# Patient Record
Sex: Female | Born: 1965 | Race: White | Hispanic: No | Smoking: Former smoker
Health system: Southern US, Community
[De-identification: ages and names within clinical notes are randomized; demographics above are authoritative.]

## PROBLEM LIST (undated history)

## (undated) DIAGNOSIS — L631 Alopecia universalis: Secondary | ICD-10-CM

## (undated) HISTORY — DX: Alopecia universalis: L63.1

---

## 2005-10-10 ENCOUNTER — Ambulatory Visit: Payer: Self-pay

## 2007-05-10 IMAGING — NM NM BONE LIMITED
1 series · 8 of 8 positions shown · non-contrast
Comparison: none

REASON FOR EXAM: COCCYGEAL PAIN
COMMENTS:

[Series 1: bone statics · 2.40mm/px · 4 acquisitions, 8 frames shown]
[im 1/4]
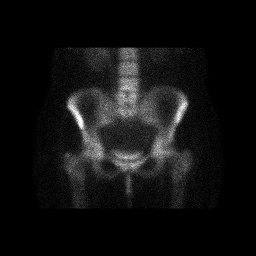
[im 1/4]
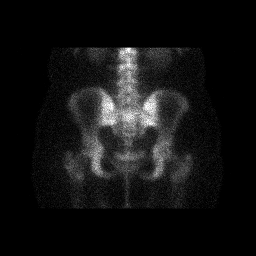
[im 2/4]
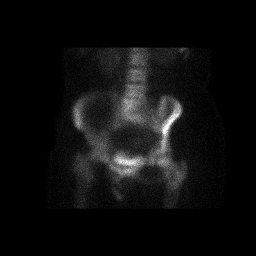
[im 2/4]
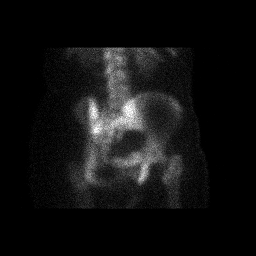
[im 3/4]
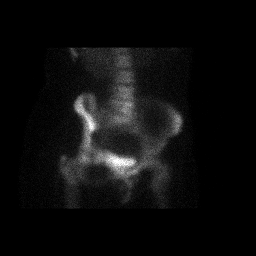
[im 3/4]
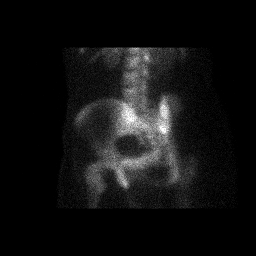
[im 4/4]
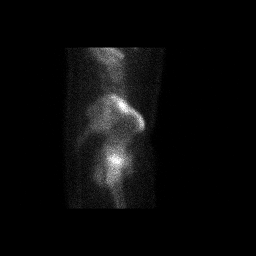
[im 4/4]
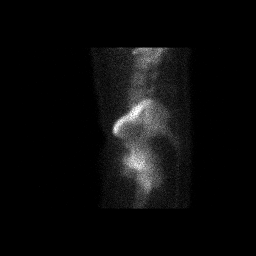

[8 of 8 positions shown; findings below may reference images not displayed]

PROCEDURE:     NM  - NM LIMITED BONE SCAN 3HR [DATE]  [DATE]

RESULT:     Following injection of 22.22 mCi of Technetium 99m MDP, limited
Bone Scan of the pelvis, lower lumbar spine and coccyx was performed.

There is normal distribution of tracer activity in the lower lumbar spine,
pelvis, sacrum and coccyx and hips.
IMPRESSION: No significant abnormalities are noted.

## 2010-07-04 HISTORY — PX: RIGHT OOPHORECTOMY: SHX2359

## 2010-07-04 HISTORY — PX: LAPAROSCOPIC SUPRACERVICAL HYSTERECTOMY: SUR797

## 2010-08-19 ENCOUNTER — Ambulatory Visit: Payer: Self-pay

## 2010-09-16 ENCOUNTER — Ambulatory Visit: Payer: Self-pay | Admitting: Obstetrics & Gynecology

## 2010-09-23 ENCOUNTER — Ambulatory Visit: Payer: Self-pay

## 2010-09-24 LAB — PATHOLOGY REPORT

## 2012-03-18 IMAGING — US US PELV - US TRANSVAGINAL
1 series · 17 of 25 positions shown · non-contrast
Comparison: none

REASON FOR EXAM: menorrhagia irregular menses
COMMENTS:

[Series 1: us pelv - us transvaginal · 17 of 115 slices shown]
[im 1/115]
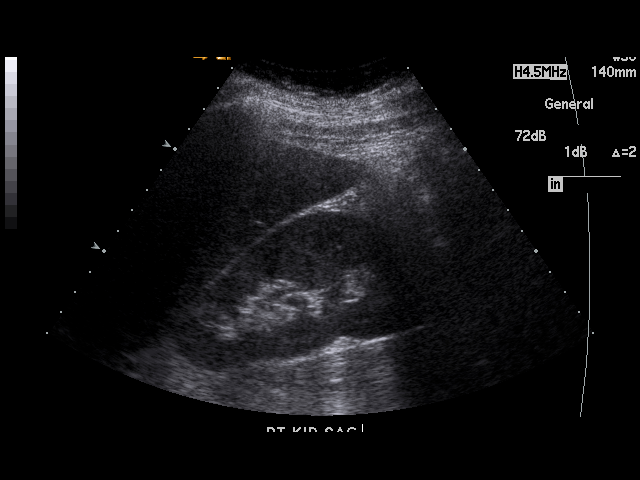
[im 10/115]
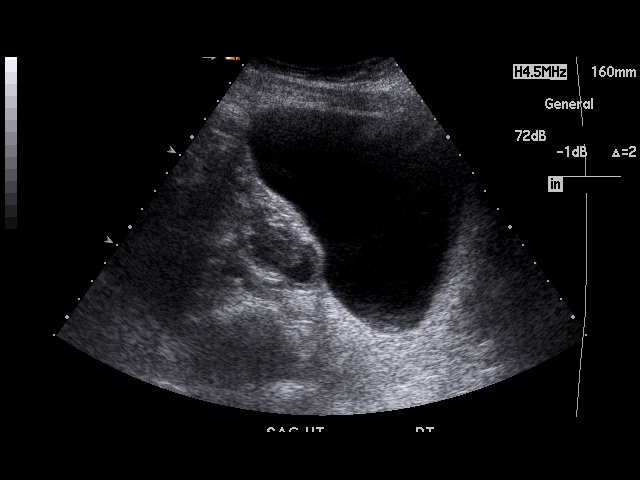
[im 15/115]
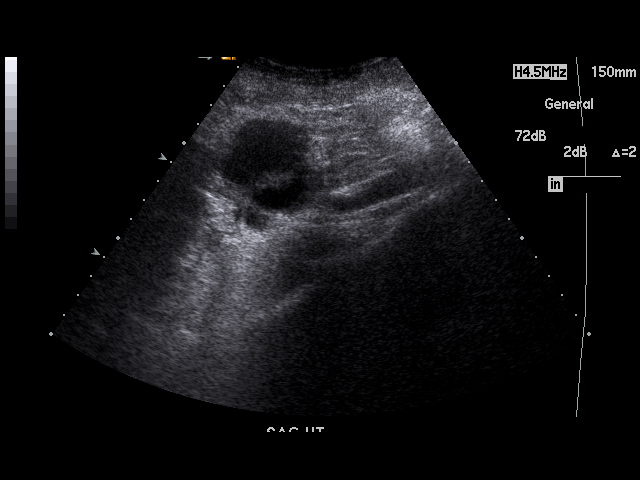
[im 24/115]
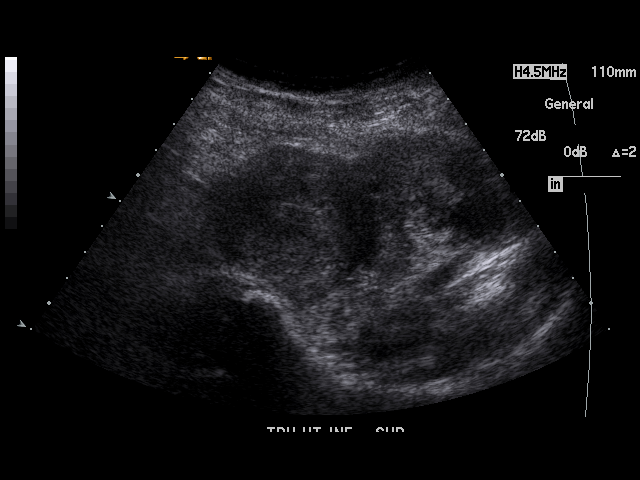
[im 29/115]
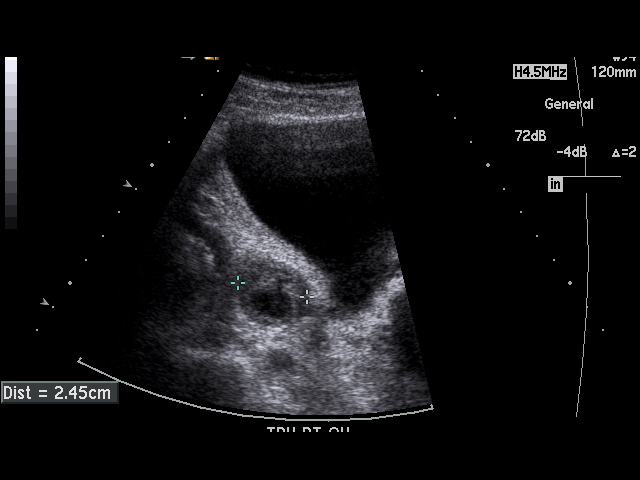
[im 39/115]
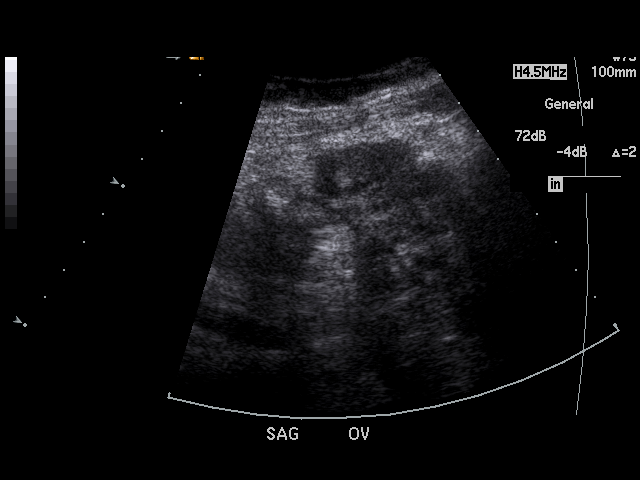
[im 43/115]
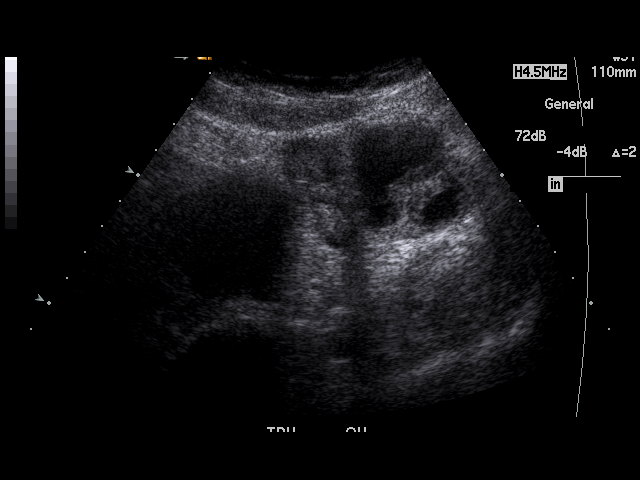
[im 53/115]
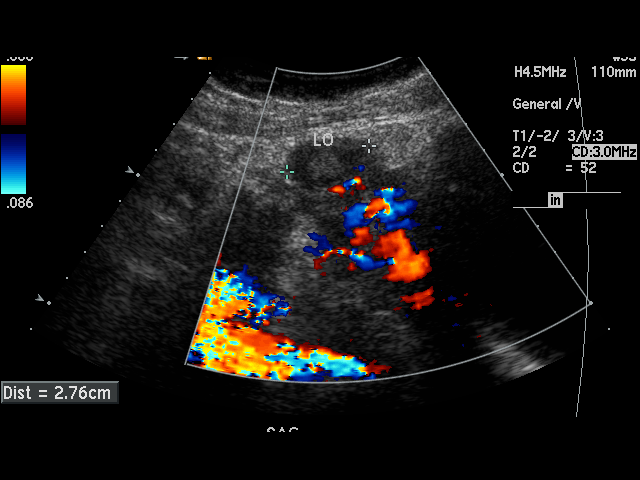
[im 58/115]
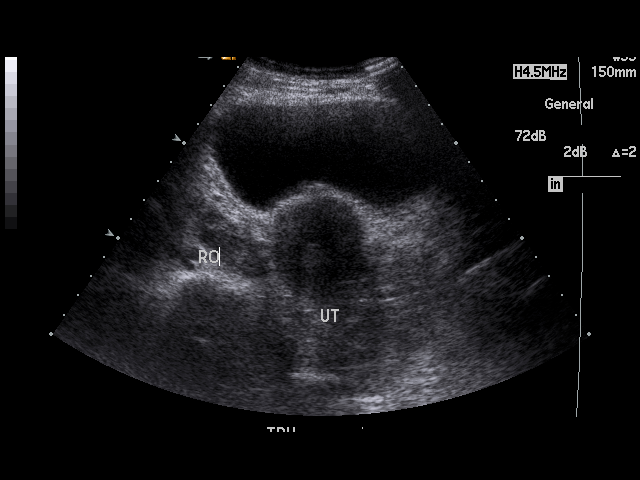
[im 62/115]
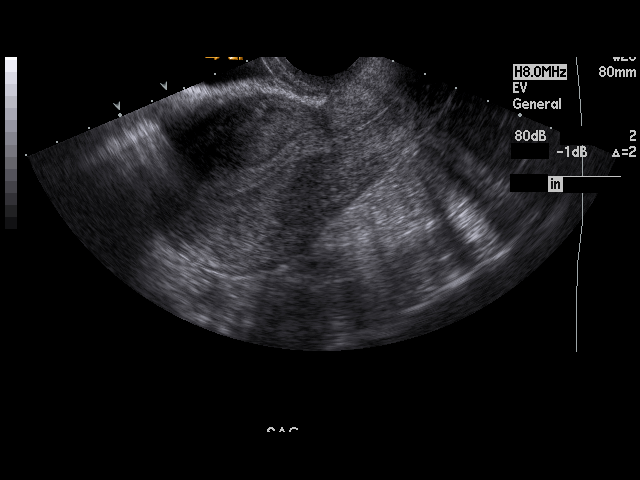
[im 72/115]
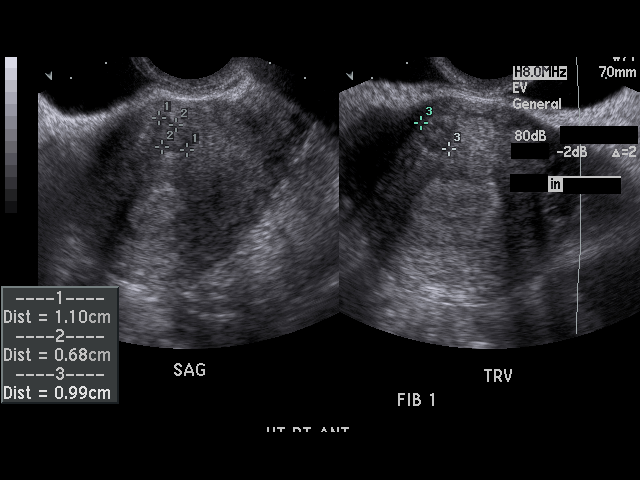
[im 77/115]
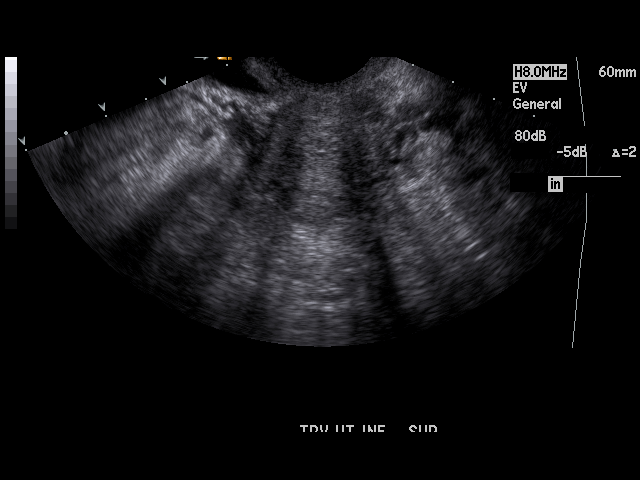
[im 86/115]
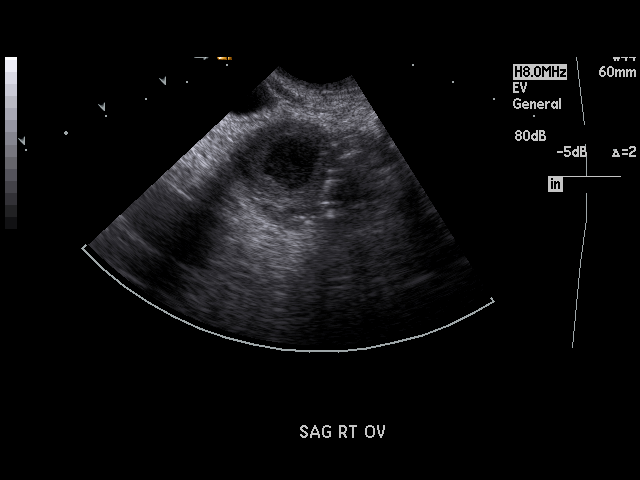
[im 91/115]
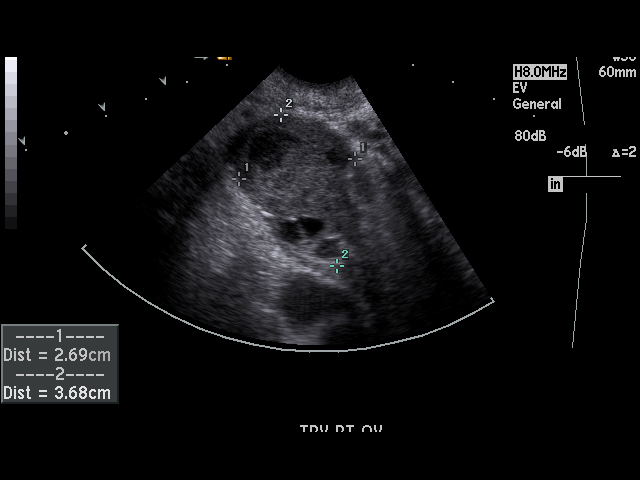
[im 100/115]
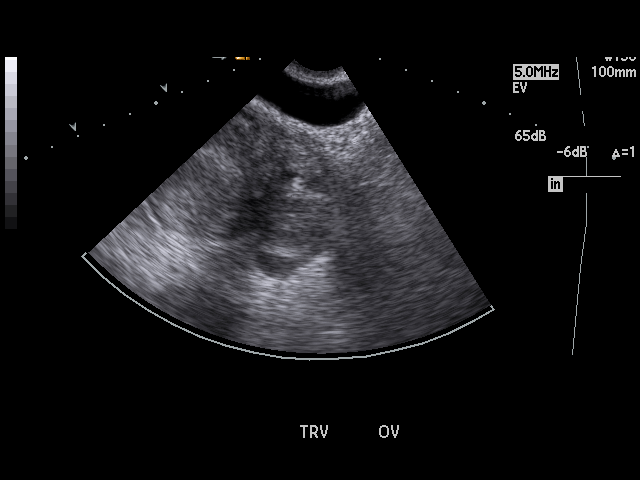
[im 105/115]
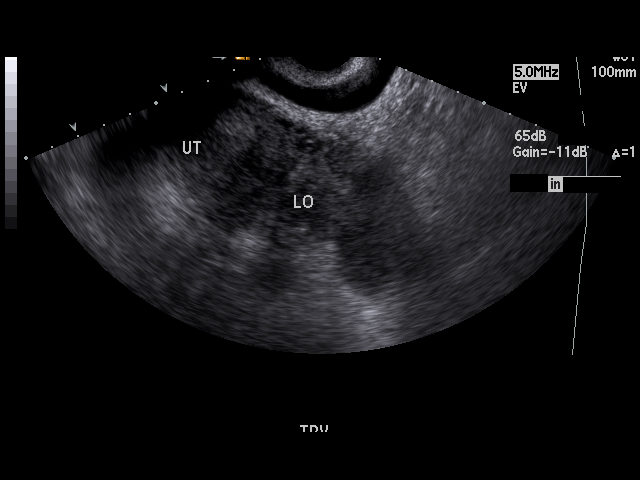
[im 115/115]
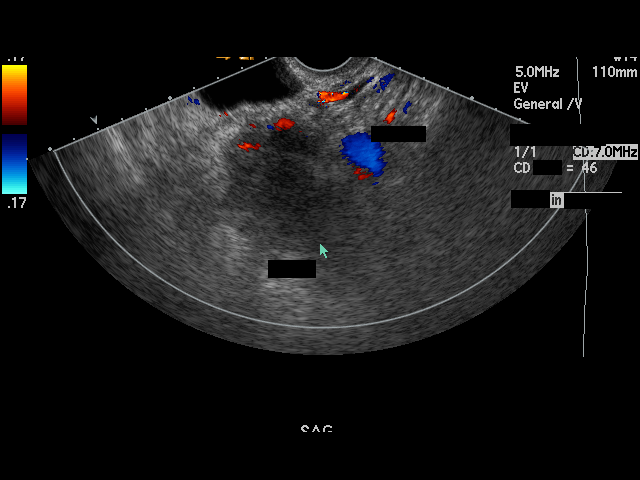

[17 of 25 positions shown; findings below may reference images not displayed]

PROCEDURE:     US  - US PELVIS EXAM W/TRANSVAGINAL  - August 19, 2010 [DATE]

RESULT:     Transabdominal and endovaginal ultrasound was performed. The
uterus measures 12.33 cm x 4.84 cm x 5.38 cm. There is a 1.1 cm hypoechoic
mass anteriorly in the uterine fundus and consistent with a small uterine
fibroid. The endometrium measures 10 mm in thickness. The right and left
ovaries are visualized. The right ovary measures 3.79 cm at maximum diameter
and the left ovary measures 2.9 cm at maximum diameter. Vascular flow is
seen in each ovary on Doppler examination. There is a 4.87 cm complex mass
associated with the left ovary. There is increased vascularity associated
with the mass. This could represent either an inflammatory mass or
neoplastic mass. No free fluid is seen in the pelvis. The kidneys show no
hydronephrosis.
IMPRESSION: 1. There is a complex 4.87 cm mass associated with the left ovary.
2. Incidental note is made of a small uterine fibroid in the fundus of the
uterus.
3. No free fluid is observed in the pelvis.

## 2014-12-15 DIAGNOSIS — D259 Leiomyoma of uterus, unspecified: Secondary | ICD-10-CM | POA: Insufficient documentation

## 2014-12-15 DIAGNOSIS — S30814D Abrasion of vagina and vulva, subsequent encounter: Secondary | ICD-10-CM

## 2014-12-15 DIAGNOSIS — F32A Depression, unspecified: Secondary | ICD-10-CM

## 2014-12-15 DIAGNOSIS — B001 Herpesviral vesicular dermatitis: Secondary | ICD-10-CM | POA: Insufficient documentation

## 2014-12-15 DIAGNOSIS — S30814A Abrasion of vagina and vulva, initial encounter: Secondary | ICD-10-CM | POA: Insufficient documentation

## 2014-12-15 DIAGNOSIS — A6000 Herpesviral infection of urogenital system, unspecified: Secondary | ICD-10-CM | POA: Insufficient documentation

## 2014-12-15 DIAGNOSIS — E559 Vitamin D deficiency, unspecified: Secondary | ICD-10-CM | POA: Insufficient documentation

## 2014-12-15 DIAGNOSIS — F419 Anxiety disorder, unspecified: Secondary | ICD-10-CM | POA: Insufficient documentation

## 2014-12-15 DIAGNOSIS — I159 Secondary hypertension, unspecified: Secondary | ICD-10-CM

## 2014-12-15 DIAGNOSIS — L659 Nonscarring hair loss, unspecified: Secondary | ICD-10-CM | POA: Insufficient documentation

## 2014-12-15 DIAGNOSIS — R87811 Vaginal high risk human papillomavirus (HPV) DNA test positive: Secondary | ICD-10-CM | POA: Insufficient documentation

## 2014-12-15 DIAGNOSIS — Z8619 Personal history of other infectious and parasitic diseases: Secondary | ICD-10-CM | POA: Insufficient documentation

## 2014-12-15 DIAGNOSIS — Z7251 High risk heterosexual behavior: Secondary | ICD-10-CM

## 2014-12-15 DIAGNOSIS — IMO0002 Reserved for concepts with insufficient information to code with codable children: Secondary | ICD-10-CM

## 2014-12-15 DIAGNOSIS — F329 Major depressive disorder, single episode, unspecified: Secondary | ICD-10-CM

## 2014-12-15 DIAGNOSIS — R7303 Prediabetes: Secondary | ICD-10-CM | POA: Insufficient documentation

## 2014-12-15 DIAGNOSIS — E669 Obesity, unspecified: Secondary | ICD-10-CM | POA: Insufficient documentation

## 2014-12-15 DIAGNOSIS — I10 Essential (primary) hypertension: Secondary | ICD-10-CM | POA: Insufficient documentation

## 2014-12-17 ENCOUNTER — Other Ambulatory Visit: Payer: Self-pay

## 2014-12-18 ENCOUNTER — Ambulatory Visit (INDEPENDENT_AMBULATORY_CARE_PROVIDER_SITE_OTHER): Payer: PRIVATE HEALTH INSURANCE | Admitting: Physician Assistant

## 2014-12-18 ENCOUNTER — Encounter: Payer: Self-pay | Admitting: Physician Assistant

## 2014-12-18 VITALS — BP 120/62 | HR 72 | Temp 98.2°F | Resp 16 | Wt 201.0 lb

## 2014-12-18 DIAGNOSIS — M7750 Other enthesopathy of unspecified foot: Secondary | ICD-10-CM

## 2014-12-18 DIAGNOSIS — M6588 Other synovitis and tenosynovitis, other site: Secondary | ICD-10-CM

## 2014-12-18 MED ORDER — MELOXICAM 15 MG PO TABS: 15.0000 mg | ORAL_TABLET | Freq: Every day | ORAL | Status: DC

## 2014-12-18 NOTE — Progress Notes (Signed)
   Subjective:    Patient ID: Lisa Ashley, female    DOB: [birth year], 49 y.o.   MRN: [id number]  Ankle Injury  The incident occurred more than 1 week ago. Incident location: unknown injury mechanism. The injury mechanism is unknown. The pain is present in the left ankle. The quality of the pain is described as aching. The pain is at a severity of 2/10. The pain is mild. The pain has been intermittent since onset. Pertinent negatives include no inability to bear weight, loss of motion, loss of sensation, muscle weakness, numbness or tingling. She reports no foreign bodies present. Nothing aggravates the symptoms. She has tried ice, elevation and rest for the symptoms. The treatment provided mild relief.      Review of Systems  Constitutional: Negative for fever, chills, activity change, fatigue and unexpected weight change.  HENT: Negative.   Respiratory: Negative for cough, chest tightness and shortness of breath.   Cardiovascular: Positive for leg swelling (left ankle). Negative for chest pain and palpitations.  Musculoskeletal: Positive for joint swelling (left ankle). Negative for myalgias, arthralgias and gait problem.  Skin: Negative for color change, rash and wound.  Neurological: Negative for tingling and numbness.       Objective:   Physical Exam  Constitutional: She appears well-developed and well-nourished. No distress.  Musculoskeletal: Normal range of motion. She exhibits edema and tenderness.       Right ankle: Normal.       Left ankle: She exhibits swelling. She exhibits normal range of motion, no ecchymosis, no deformity and normal pulse. Tenderness. Lateral malleolus tenderness found. Achilles tendon normal.       Feet:  Vitals reviewed.         Assessment & Plan:  1. Tendinitis of ankle Possible tendinitis of peroneal tendon.  Will treat with anti-inflammatory.  Advised to apply compression with ACE bandage especially when she is going to be up on her feet a lot.   Ice after being up on her feet.  Elevate when she can. - meloxicam (MOBIC) 15 MG tablet; Take 1 tablet (15 mg total) by mouth daily.  Dispense: 30 tablet; Refill: 0

## 2014-12-18 NOTE — Patient Instructions (Signed)

## 2015-01-19 ENCOUNTER — Encounter: Payer: Self-pay | Admitting: Physician Assistant

## 2015-01-19 ENCOUNTER — Ambulatory Visit (INDEPENDENT_AMBULATORY_CARE_PROVIDER_SITE_OTHER): Payer: No Typology Code available for payment source | Admitting: Physician Assistant

## 2015-01-19 VITALS — BP 110/84 | HR 68 | Temp 98.7°F | Resp 16 | Ht 71.0 in | Wt 205.4 lb

## 2015-01-19 DIAGNOSIS — M6588 Other synovitis and tenosynovitis, other site: Secondary | ICD-10-CM

## 2015-01-19 DIAGNOSIS — F419 Anxiety disorder, unspecified: Secondary | ICD-10-CM

## 2015-01-19 DIAGNOSIS — M7750 Other enthesopathy of unspecified foot: Secondary | ICD-10-CM

## 2015-01-19 MED ORDER — MELOXICAM 15 MG PO TABS: 15.0000 mg | ORAL_TABLET | Freq: Every day | ORAL | Status: DC

## 2015-01-19 MED ORDER — ALPRAZOLAM 0.5 MG PO TABS: 0.5000 mg | ORAL_TABLET | Freq: Three times a day (TID) | ORAL | Status: DC | PRN

## 2015-01-19 NOTE — Patient Instructions (Signed)
Generalized Anxiety Disorder Generalized anxiety disorder (GAD) is a mental disorder. It interferes with life functions, including relationships, work, and school. GAD is different from normal anxiety, which everyone experiences at some point in their lives in response to specific life events and activities. Normal anxiety actually helps us prepare for and get through these life events and activities. Normal anxiety goes away after the event or activity is over.  GAD causes anxiety that is not necessarily related to specific events or activities. It also causes excess anxiety in proportion to specific events or activities. The anxiety associated with GAD is also difficult to control. GAD can vary from mild to severe. People with severe GAD can have intense waves of anxiety with physical symptoms (panic attacks).  SYMPTOMS The anxiety and worry associated with GAD are difficult to control. This anxiety and worry are related to many life events and activities and also occur more days than not for 6 months or longer. People with GAD also have three or more of the following symptoms (one or more in children):  Restlessness.   Fatigue.  Difficulty concentrating.   Irritability.  Muscle tension.  Difficulty sleeping or unsatisfying sleep. DIAGNOSIS GAD is diagnosed through an assessment by your health care provider. Your health care provider will ask you questions aboutyour mood,physical symptoms, and events in your life. Your health care provider may ask you about your medical history and use of alcohol or drugs, including prescription medicines. Your health care provider may also do a physical exam and blood tests. Certain medical conditions and the use of certain substances can cause symptoms similar to those associated with GAD. Your health care provider may refer you to a mental health specialist for further evaluation. TREATMENT The following therapies are usually used to treat GAD:    Medication. Antidepressant medication usually is prescribed for long-term daily control. Antianxiety medicines may be added in severe cases, especially when panic attacks occur.   Talk therapy (psychotherapy). Certain types of talk therapy can be helpful in treating GAD by providing support, education, and guidance. A form of talk therapy called cognitive behavioral therapy can teach you healthy ways to think about and react to daily life events and activities.  Stress managementtechniques. These include yoga, meditation, and exercise and can be very helpful when they are practiced regularly. A mental health specialist can help determine which treatment is best for you. Some people see improvement with one therapy. However, other people require a combination of therapies. Document Released: 10/15/2012 Document Revised: 11/04/2013 Document Reviewed: 10/15/2012 Exit[employer] Patient Information 2015 [employer]. This information is not intended to replace advice given to you by your health care provider. Make sure you discuss any questions you have with your health care provider.  

## 2015-01-20 NOTE — Progress Notes (Signed)
Subjective:    Patient ID: Lisa Ashley, female    DOB: Jan 24, 1966, 49 y.o.   MRN: [id number]  Anxiety Presents for follow-up visit. Symptoms include nervous/anxious behavior (nervous about her son-in-law who is an Garment/textile technologist in [employer]). Patient reports no chest pain, compulsions, confusion, decreased concentration, depressed mood, dizziness, dry mouth, excessive worry, feeling of choking, hyperventilation, impotence, insomnia, irritability, malaise, muscle tension, nausea, obsessions, palpitations, panic, restlessness, shortness of breath or suicidal ideas. Symptoms occur most days. The severity of symptoms is moderate. The symptoms are aggravated by family issues and work stress. The quality of sleep is fair. Nighttime awakenings: occasional.   There are no known risk factors. Her past medical history is significant for anxiety/panic attacks and depression. There is no history of anemia, arrhythmia, asthma, bipolar disorder, CAD, CHF, chronic lung disease, fibromyalgia, hyperthyroidism or suicide attempts. Past treatments include benzodiazephines, lifestyle changes and SSRIs. The treatment provided significant relief. Compliance with prior treatments has been good. Compliance with medications is 76-100%.  Ankle Injury  The incident occurred more than 1 week ago. Incident location: unknown injury mechanism. The injury mechanism is unknown. The pain is present in the left ankle. The quality of the pain is described as aching. The pain is at a severity of 0/10. The patient is experiencing no pain. The pain has been improving since onset. Pertinent negatives include no inability to bear weight, loss of motion, loss of sensation, muscle weakness, numbness or tingling. She reports no foreign bodies present. The symptoms are aggravated by movement. She has tried ice, elevation, rest and NSAIDs for the symptoms. The treatment provided significant relief.  Her ankle has improved and she now only has intermittent  pain after working her second job where she is on her feet the whole time.  She has been using an ankle compression sleeve at work and this has helped.  Now her only compliant is occasionally when she gets out of bed in the morning that it "feels very tight and that it may rip."    Review of Systems  Constitutional: Negative for fever, chills, activity change, irritability, fatigue and unexpected weight change.  HENT: Negative.   Respiratory: Negative for cough, chest tightness and shortness of breath.   Cardiovascular: Positive for leg swelling (left ankle). Negative for chest pain and palpitations.  Gastrointestinal: Negative for nausea.  Genitourinary: Negative for impotence.  Musculoskeletal: Positive for joint swelling (left ankle). Negative for myalgias, arthralgias and gait problem.  Skin: Negative for color change, rash and wound.  Neurological: Negative for dizziness, tingling and numbness.  Psychiatric/Behavioral: Negative for suicidal ideas, confusion and decreased concentration. The patient is nervous/anxious (nervous about her son-in-law who is an Garment/textile technologist in [employer]). The patient does not have insomnia.        Objective:   Physical Exam  Constitutional: She appears well-developed and well-nourished. No distress.  Cardiovascular: Normal rate, regular rhythm and normal heart sounds.  Exam reveals no gallop and no friction rub.   No murmur heard. Pulmonary/Chest: Effort normal and breath sounds normal. No respiratory distress. She has no wheezes. She has no rales.  Musculoskeletal: Normal range of motion. She exhibits no edema or tenderness.       Right ankle: Normal.       Left ankle: She exhibits swelling. She exhibits normal range of motion, no ecchymosis, no deformity and normal pulse. Achilles tendon normal.       Feet:  Psychiatric: She has a normal mood and affect. Her behavior is normal.  Judgment and thought content normal.  Vitals reviewed.           Assessment & Plan:  1. Tendinitis of ankle Improving.  Discussed stretching techniques.  Advised to add ice massage.  May use creams such as icy-hot or biofreeze along the tendon.  Continue to use the ankle compression sleeve.  Elevate ankle when able.  Mobic now prn use. - meloxicam (MOBIC) 15 MG tablet; Take 1 tablet (15 mg total) by mouth daily.  Dispense: 30 tablet; Refill: 0  2. Anxiety Controlled, but has noticed she has needed the full dose prescribed of xanax instead of only one a day to help sleep.  Her daughter has been very worried about her husband who is a Education officer, museum, and she discusses this with Lisa Ashley.  This is making her feel more anxious, worried and concerned as she wants to console her daughter and help her with this, but understands there is not much she can do besides support her.  This has made her anxiety increase as well.  We discussed other relaxation techniques to hopefully help decrease the use of the xanax. - ALPRAZolam (XANAX) 0.5 MG tablet; Take 1 tablet (0.5 mg total) by mouth 3 (three) times daily as needed for anxiety.  Dispense: 30 tablet; Refill: 1

## 2015-03-23 ENCOUNTER — Encounter: Payer: Self-pay | Admitting: Physician Assistant

## 2015-03-23 ENCOUNTER — Ambulatory Visit (INDEPENDENT_AMBULATORY_CARE_PROVIDER_SITE_OTHER): Payer: No Typology Code available for payment source | Admitting: Physician Assistant

## 2015-03-23 VITALS — BP 120/80 | HR 70 | Temp 98.2°F | Resp 16 | Wt 207.0 lb

## 2015-03-23 DIAGNOSIS — F419 Anxiety disorder, unspecified: Secondary | ICD-10-CM

## 2015-03-23 DIAGNOSIS — M6588 Other synovitis and tenosynovitis, other site: Secondary | ICD-10-CM

## 2015-03-23 DIAGNOSIS — M7750 Other enthesopathy of unspecified foot: Secondary | ICD-10-CM

## 2015-03-23 MED ORDER — ALPRAZOLAM 0.5 MG PO TABS: 0.5000 mg | ORAL_TABLET | Freq: Three times a day (TID) | ORAL | Status: DC | PRN

## 2015-03-23 MED ORDER — MELOXICAM 15 MG PO TABS: 15.0000 mg | ORAL_TABLET | Freq: Every day | ORAL | Status: DC

## 2015-03-23 NOTE — Progress Notes (Signed)
Patient: Lisa Ashley Female    DOB: 14-Dec-1965   49 y.o.   MRN: [id number] Visit Date: 03/23/2015  Today's Provider: Mar Daring, PA-C   Chief Complaint  Patient presents with  . Medication Refill   Subjective:    Anxiety Presents for follow-up visit. Progression since onset: Just days with the anxiety and to try to sleep at night. Symptoms include depressed mood (sometimes), excessive worry, irritability and nervous/anxious behavior. Patient reports no chest pain, decreased concentration, dizziness, dry mouth, hyperventilation, nausea, palpitations, panic or shortness of breath. Symptoms occur most days (most days to occasional a minimum to once a week or two a week). Duration: to 30- 45 minutes. The severity of symptoms is moderate. The symptoms are aggravated by work stress. Hours of sleep per night: 6-7 hours. The quality of sleep is fair (It has improved since taking the Mobic Medicine). Nighttime awakenings: none.   The treatment provided significant relief. Compliance with prior treatments has been good.   Lisa Ashley is a 49 year old who is here today for medication Refill on her Xanax and Meloxicam. She is requesting a prescription for meloxicam because a couple of weeks ago she stubbed her foot against the table and is still having pain in her fifth toe. She states there was discoloration at the bases of the metatarsals all the way across along with swelling. This has now subsided and she is just having the tenderness to the fifth toe. She states it is still uncomfortable to wear loose toed shoes and when she wears her ankle brace for the tendinitis it compresses that area as well causes discomfort. The meloxicam has been helping with this pain and the swelling. 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 She has noticed marked improvement in her anxiety over the past couple months. She states the Xanax does  help when she has a bad attack which is most often precipitated with work. She has had a lot of work changes recently. Her main job is working for [employer] that has increased recently been absorbed by [employer]. This has changed a lot of requirements at work, as well as she lost her 23 year experience and essentially had to start back at day 1. She also had to go from biweekly salary to monthly salary and has been having a difficult time with budgeting. These have caused some increased anxiety but she states she is still only taking 1-2 tablets of alprazolam weekly. She also has noticed that she has not required any alprazolam when she has to go on work trips like she used to.    Allergies  Allergen Reactions  . Codeine Nausea And Vomiting   Previous Medications   ALPRAZOLAM (XANAX) 0.5 MG TABLET    Take 1 tablet (0.5 mg total) by mouth 3 (three) times daily as needed for anxiety.   CHOLECALCIFEROL (VITAMIN D3) 2000 UNITS TABS    Take 1 tablet by mouth daily.   ESCITALOPRAM (LEXAPRO) 10 MG TABLET    Take 10 mg by mouth daily.   FERROUS SULFATE (IRON SUPPLEMENT) 325 (65 FE) MG TABLET    Take 1 tablet by mouth daily.   GREEN TEA, CAMILLIA SINENSIS, PO    Take 1 tablet by mouth daily.   MELOXICAM (MOBIC) 15 MG TABLET    Take 1 tablet (15 mg total) by mouth daily.  MULTIPLE VITAMIN PO    Take 1 tablet by mouth daily.   OMEGA-3 FATTY ACIDS (FISH OIL CONCENTRATE) 435 MG CAPS    Take 1 capsule by mouth daily.   OMEGA-3 FATTY ACIDS (FISH OIL PO)    Take 1,000 mg by mouth daily.   OVER THE COUNTER MEDICATION    Take by mouth 1 day or 1 dose.   VITAMIN E 400 UNIT CAPSULE    Take 200 Units by mouth daily.   ZINC SULFATE (ZINC 15 PO)    Take 1 tablet by mouth daily.    Review of Systems  Constitutional: Positive for irritability.  HENT: Negative.   Eyes: Negative.   Respiratory: Positive for cough (in the morning). Negative for shortness of breath.   Cardiovascular: Negative.   Negative for chest pain and palpitations.  Gastrointestinal: Negative.  Negative for nausea.  Endocrine: Negative.   Genitourinary: Negative.   Musculoskeletal: Negative.   Skin: Negative.   Allergic/Immunologic: Negative.   Neurological: Negative.  Negative for dizziness.  Hematological: Negative.   Psychiatric/Behavioral: Negative for decreased concentration. The patient is nervous/anxious.     Social History  Substance Use Topics  . Smoking status: Former Smoker -- 1.00 packs/day for 5 years    Types: Cigarettes  . Smokeless tobacco: Not on file     Comment: QUIT IN 2011  . Alcohol Use: No   Objective:   BP 120/80 mmHg  Pulse 70  Temp(Src) 98.2 F (36.8 C) (Oral)  Resp 16  Wt 207 lb (93.895 kg)  Physical Exam  Constitutional: She appears well-developed and well-nourished. No distress.  Cardiovascular: Normal rate, regular rhythm and normal heart sounds.  Exam reveals no gallop and no friction rub.   No murmur heard. Pulmonary/Chest: Effort normal and breath sounds normal. No respiratory distress. She has no wheezes. She has no rales.  Skin: She is not diaphoretic.  Psychiatric: She has a normal mood and affect. Her behavior is normal. Judgment and thought content normal.  Vitals reviewed.       Assessment & Plan:     1. Anxiety Improving. Alprazolam refilled as below. She is to call the office if she has any worsening symptoms or increasing anxiety. - ALPRAZolam (XANAX) 0.5 MG tablet; Take 1 tablet (0.5 mg total) by mouth 3 (three) times daily as needed for anxiety.  Dispense: 30 tablet; Refill: 1  2. Tendinitis of ankle This has been stable. 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 She is constantly standing and on concrete floor. She has changed her shoes and does wear an ankle brace to help with stability. She has only been using meloxicam as needed when her ankle is more sore after work. She did use  the meloxicam daily for the last week after she hit her foot against a table. Refill the meloxicam as below for her as needed use after work to prevent further irritation and inflammation of the Achilles tendon. - meloxicam (MOBIC) 15 MG tablet; Take 1 tablet (15 mg total) by mouth daily.  Dispense: 30 tablet; Refill: [place], PA-C  [facility]

## 2015-05-14 ENCOUNTER — Encounter: Payer: Self-pay | Admitting: Physician Assistant

## 2015-05-14 ENCOUNTER — Ambulatory Visit (INDEPENDENT_AMBULATORY_CARE_PROVIDER_SITE_OTHER): Payer: PRIVATE HEALTH INSURANCE | Admitting: Physician Assistant

## 2015-05-14 VITALS — BP 130/60 | HR 72 | Temp 98.4°F | Resp 16 | Wt 211.0 lb

## 2015-05-14 DIAGNOSIS — M7750 Other enthesopathy of unspecified foot: Secondary | ICD-10-CM

## 2015-05-14 DIAGNOSIS — F329 Major depressive disorder, single episode, unspecified: Secondary | ICD-10-CM

## 2015-05-14 DIAGNOSIS — F419 Anxiety disorder, unspecified: Secondary | ICD-10-CM

## 2015-05-14 DIAGNOSIS — F32A Depression, unspecified: Secondary | ICD-10-CM

## 2015-05-14 DIAGNOSIS — M6588 Other synovitis and tenosynovitis, other site: Secondary | ICD-10-CM

## 2015-05-14 MED ORDER — ALPRAZOLAM 0.5 MG PO TABS: 0.5000 mg | ORAL_TABLET | Freq: Two times a day (BID) | ORAL | Status: DC | PRN

## 2015-05-14 MED ORDER — ESCITALOPRAM OXALATE 10 MG PO TABS: 10.0000 mg | ORAL_TABLET | Freq: Every day | ORAL | Status: DC

## 2015-05-14 MED ORDER — MELOXICAM 15 MG PO TABS: 15.0000 mg | ORAL_TABLET | Freq: Every day | ORAL | Status: DC

## 2015-05-14 NOTE — Patient Instructions (Addendum)
Anterior Ankle Impingement With Rehab  *Do stated exercises below 3 sets of 10-15 reps; Hold stretches for 15 seconds* An impingement is a pinching of bone or tissue. The front (anterior) of the ankle is an area of the body that is susceptible to impingement. Anterior ankle impingements may be caused by either an acute injury or repetitive injury. Often, ankle impingement occurs after a previous ankle injury, such as an ankle sprain. The injured tissue scars and becomes caught between two bones, usually the shinbone (tibia) and the bones of the upper foot (talus bones). Anterior ankle impingement may also be caused by bone spurs at the front of the lower end of the tibia that pushes against other tissues. The pressure on the surrounding tissue causes pain and inflammation. The swelling of the tissue often causes the impaction to become worse and causes even more pain. SYMPTOMS   Pain with bringing the foot towards the shin (flexion).  Loss of ability to push off or drive (the ability to run forcefully) or inability to run, cut, or jump at full speed.  Swelling (occasionally).  Locking of the joint (rarely). CAUSES  Anterior ankle impingement is caused by tissue (bone or soft tissue) being pinched by other structures in the ankle, such as two bones or a bone spur. Anterior ankle impingement may be caused by acute or repetitive injury. RISK INCREASES WITH:  Sports that require repetitive or forceful extension (pushing the foot downward) of the ankle (sprinting or jumping).  Repeated injuries to the foot or ankle.  Poor ankle strength and flexibility.  Failure to warm-up properly before activity. PREVENTION   Warm-up and stretch properly before activity.  Maintain physical fitness:  Strength.  Flexibility.  Cardiovascular fitness.  Learn and use proper technique.  Wear proper protective taping or bracing to prevent ankle hyperextension or repeated injury.  Allow complete recovery  after an ankle or foot injury before returning to any sport that requires ankle extension. PROGNOSIS  If treated properly, anterior ankle impingement is usually curable with nonsurgical (conservative) treatment. However, sometimes surgery is necessary in order to alleviate the negative symptoms.  RELATED COMPLICATIONS   Frequent recurrence of symptoms, which may result in chronically inflamed tissue and eventually a chronic problem.  Disability severe enough to diminish an athlete's competitive ability.  Arthritis of the ankle. TREATMENT Treatment initially consists of ice and medicine to help reduce pain and inflammation. Wearing an elastic compression bandage, as well as performing strengthening and stretching exercises may also help reduce the negative symptoms. These exercises may be done at home or with a therapist. Your caregiver may choose to give you an ankle brace to reduce the movement of the ankle. This may help reduce inflammation and pain. If symptoms persist despite conservative treatment, surgery may be necessary. Surgery usually involves using arthroscopic methods to remove the bone spur, inflamed tissue, or scar tissue.  MEDICATION   If pain medicine is necessary, nonsteroidal anti-inflammatory medicines such as aspirin and ibuprofen or other minor pain relievers such as acetaminophen are often recommended.  Do not take pain medicine within 7 days before surgery.  Prescription pain relievers may be given to you by your caregiver. Use only as directed and only as much as you need.  Ointments applied to the skin may be helpful.  Corticosteroid injections may be given to help reduce inflammation. However, corticosteroid injections are only used for severe cases. HEAT AND COLD  Cold treatment (icing) relieves pain and reduces inflammation. Cold treatment should be  applied for 10 to 15 minutes every 2 to 3 hours for inflammation and pain and immediately after any activity that  aggravates your symptoms. Use ice packs or an ice massage.  Heat treatment may be used prior to performing the stretching and strengthening activities prescribed by your caregiver, physical therapist, or athletic trainer. Use a heat pack or a warm soak. SEEK MEDICAL CARE IF:   Your symptoms get worse or do not improve in 2 weeks despite treatment.  You experience any of the following after surgery:  Pain, numbness, or coldness in the foot and ankle.  Blue, gray, or dusky color in the toenails.  Increased pain, swelling, redness, drainage, or bleeding in the surgical area.  You have signs of infection (headache, muscle aches, dizziness, or a general ill feeling with fever).  You develop new, unexplained symptoms. (Drugs used in treatment may produce side effects.) EXERCISES  RANGE OF MOTION (ROM) AND STRETCHING EXERCISES -- Anterior Ankle Impingement These exercises may help you when beginning to rehabilitate your injury. Your symptoms may resolve with or without further involvement from your physician, physical therapist, or athletic trainer. While completing these exercises, remember:   Restoring tissue flexibility helps normal motion to return to the joints. This allows healthier, less painful movement and activity.  An effective stretch should be held for at least 30 seconds.  A stretch should never be painful. You should only feel a gentle lengthening or release in the stretched tissue. STRETCH -- Gastrocnemius, Standing  Place hands on wall.  Extend right / left leg and place a folded washcloth under the arch of your foot for support. Keep the front knee somewhat bent.  Slightly point your toes inward on your back foot.  Keeping your right / left heel on the floor and your knee straight, shift your weight toward the wall, not allowing your back to arch.  You should feel a gentle stretch in the calf. Hold this position for __________ seconds. Repeat __________ times.  Complete this stretch __________ times per day. STRETCH -- Soleus, Standing  Place hands on wall.  Extend right / left leg and place a folded washcloth under the arch of your foot for support. Keep the front knee somewhat bent.  Slightly point your toes inward on your back foot.  Keep your right / left heel on the floor, bend your back knee, and slightly shift your weight over the back leg so that you feel a gentle stretch deep in your back calf.  Hold this position for __________ seconds. Repeat __________ times. Complete this stretch __________ times per day. RANGE OF MOTION -- Ankle Plantar Flexion   Sit with your right / left leg crossed over your opposite knee.  Use your opposite hand to pull the top of your foot and toes toward you.  You should feel a gentle stretch on the top of your foot or ankle. Hold this position for __________ seconds. Repeat __________ times. Complete __________ times per day.  RANGE OF MOTION -- Ankle Eversion  Sit with your right / left ankle crossed over your opposite knee.  Grip your foot with your opposite hand, placing your thumb on the top of your foot and your fingers across the bottom of your foot.  Gently push your foot downward with a slight rotation so your littlest toes rise slightly.  You should feel a gentle stretch on the inside of your ankle. Hold the stretch for __________ seconds. Repeat __________ times. Complete this exercise __________ times  per day.  RANGE OF MOTION -- Ankle Inversion   Sit with your right / left ankle crossed over your opposite knee.  Grip your foot with your opposite hand, placing your thumb on the bottom of your foot and your fingers across the top of your foot.  Gently pull your foot so the smallest toe comes toward you and your thumb pushes the inside of the ball of your foot away from you.  You should feel a gentle stretch on the outside of your ankle. Hold the stretch for __________ seconds. Repeat  __________ times. Complete this exercise __________ times per day.  STRENGTHENING EXERCISES -- Anterior Ankle Impingement These exercises may help you when beginning to rehabilitate your injury. They may resolve your symptoms with or without further involvement from your physician, physical therapist, or athletic trainer. While completing these exercises, remember:   Muscles can gain both the endurance and the strength needed for everyday activities through controlled exercises.  Complete these exercises as instructed by your physician, physical therapist, or athletic trainer. Progress the resistance and repetitions only as guided.  You may experience muscle soreness or fatigue, but the pain or discomfort you are trying to eliminate should never worsen during these exercises. If this pain does worsen, stop and make certain you are following the directions exactly. If the pain is still present after adjustments, discontinue the exercise until you can discuss the trouble with your clinician. STRENGTH -- Dorsiflexors  Secure a rubber exercise band or tubing to a fixed object (such as a table, pole) and loop the other end around your right / left foot.  Sit on the floor facing the fixed object. The band or tubing should be slightly tense when your foot is relaxed.  Slowly draw your foot back toward you using your ankle and toes.  Hold this position for __________ seconds. Slowly release the tension in the band and return your foot to the starting position. Repeat __________ times. Complete this exercise __________ times per day.  STRENGTH -- Ankle Eversion  Secure one end of a rubber exercise band or tubing to a fixed object (table, pole). Loop the other end around your foot just before your toes.  Place your fists between your knees. This will focus your strengthening at your ankle.  Drawing the band or tubing across your opposite foot, slowly, pull your little toe out and up. Make sure the  band or tubing is positioned to resist the entire motion.  Hold this position for __________ seconds.  Have your muscles resist the band or tubing as it slowly pulls your foot back to the starting position. Repeat __________ times. Complete this exercise __________ times per day.  STRENGTH -- Ankle Inversion  Secure one end of a rubber exercise band or tubing to a fixed object (table, pole). Loop the other end around your foot just before your toes.  Place your fists between your knees. This will focus your strengthening at your ankle.  Slowly, pull your big toe up and in, making sure the band or tubing is positioned to resist the entire motion.  Hold this position for __________ seconds.  Have your muscles resist the band or tubing as it slowly pulls your foot back to the starting position. Repeat __________ times. Complete this exercises __________ times per day.  STRENGTH -- Plantar-flexors, Eccentric Note: This exercise can place a lot of stress on your foot and ankle. Please complete this exercise only if specifically instructed by your caregiver.  Place the balls of your feet on a step. With your hands, use only enough support from a wall or rail to keep your balance.  Keep your knees straight and rise up on your toes.  Slowly shift your weight entirely to your right / left toes and pick up your opposite foot. Gently and with controlled movement, lower your weight through your right / left foot so that your heel drops below the level of the step. You will feel a slight stretch in the back of your calf at the ending position.  Use the healthy leg to help rise up onto the balls of both feet, then lower weight only on the right / left leg again. Build up to 15 repetitions. Then progress to 3 consecutive sets of 15 repetitions.*  After completing the above exercise, complete the same exercise with a slight knee bend (about 30 degrees). Again, build up to 15 repetitions. Then progress  to 3 consecutive sets of 15 repetitions.* Perform this exercise __________ times per day.  *When you easily complete 3 sets of 15, your physician, physical therapist, or athletic trainer may advise you to add resistance by wearing a backpack filled with additional weight. BALANCE -- Inversion/Eversion Use caution, these are advanced level exercises. Do not begin them until you are advised to do so.   Create a balance board using a sturdy board about 1 feet (40 cm) long and 1-1 feet (30-40 cm) wide and a 1 inch (3 cm) diameter rod or pipe that is as long as the board's width. A copper pipe or a solid broomstick work well.  Stand on a non-carpeted surface near a countertop or wall. Step onto the board so that your feet are hip-width apart and equally straddle the rod or pipe.  Keeping your feet in place, complete these two exercises without shifting your upper body or hips:  Tip the board from side-to-side. Control the movement so the board does not forcefully strike the ground. The board should silently tap the ground.  Tip the board side-to-side without striking the ground. Occasionally pause and maintain a steady position at various points.  Repeat the first two exercises, but use only your right / left foot. Place your right / left foot directly over the rod or pipe. Repeat __________ times. Complete this exercise __________ times a day. BALANCE -- Plantar/Dorsi Flexion Use caution, these are advanced level exercises. Do not begin them until you are advised to do so.   Create a balance board using a sturdy board about 1 feet (40 cm) long and 1-1 feet (30-40 cm) wide and a 1 inch (3 cm) diameter rod or pipe that is as long as the board's width. A copper pipe or a solid broomstick work well.  Stand on a non-carpeted surface near a countertop or wall. Stand on the board so that the rod or pipe runs under the arches in your feet.  Keeping your feet in place, complete these two exercises  without shifting your upper body or hips:  Tip the board from side-to-side. Control the movement so the board does not forcefully strike the ground. The board should silently tap the ground.  Tip the board side-to-side without striking the ground. Occasionally pause and maintain a steady position at various points.  Repeat the first two exercises, but use only your right / left foot. Stand in the center of the board. Repeat __________ times. Complete this exercise __________ times a day.   This information is not  intended to replace advice given to you by your health care provider. Make sure you discuss any questions you have with your health care provider.   Document Released: 01/19/2005 Document Revised: 07/11/2014 Document Reviewed: 10/02/2008 Elsevier Interactive Patient Education 2016 [employer] Generalized Anxiety Disorder Generalized anxiety disorder (GAD) is a mental disorder. It interferes with life functions, including relationships, work, and school. GAD is different from normal anxiety, which everyone experiences at some point in their lives in response to specific life events and activities. Normal anxiety actually helps [place] prepare for and get through these life events and activities. Normal anxiety goes away after the event or activity is over.  GAD causes anxiety that is not necessarily related to specific events or activities. It also causes excess anxiety in proportion to specific events or activities. The anxiety associated with GAD is also difficult to control. GAD can vary from mild to severe. People with severe GAD can have intense waves of anxiety with physical symptoms (panic attacks).  SYMPTOMS The anxiety and worry associated with GAD are difficult to control. This anxiety and worry are related to many life events and activities and also occur more days than not for 6 months or longer. People with GAD also have three or more of the following symptoms (one or more in  children):  Restlessness.   Fatigue.  Difficulty concentrating.   Irritability.  Muscle tension.  Difficulty sleeping or unsatisfying sleep. DIAGNOSIS GAD is diagnosed through an assessment by your health care provider. Your health care provider will ask you questions aboutyour mood,physical symptoms, and events in your life. Your health care provider may ask you about your medical history and use of alcohol or drugs, including prescription medicines. Your health care provider may also do a physical exam and blood tests. Certain medical conditions and the use of certain substances can cause symptoms similar to those associated with GAD. Your health care provider may refer you to a mental health specialist for further evaluation. TREATMENT The following therapies are usually used to treat GAD:   Medication. Antidepressant medication usually is prescribed for long-term daily control. Antianxiety medicines may be added in severe cases, especially when panic attacks occur.   Talk therapy (psychotherapy). Certain types of talk therapy can be helpful in treating GAD by providing support, education, and guidance. A form of talk therapy called cognitive behavioral therapy can teach you healthy ways to think about and react to daily life events and activities.  Stress managementtechniques. These include yoga, meditation, and exercise and can be very helpful when they are practiced regularly. A mental health specialist can help determine which treatment is best for you. Some people see improvement with one therapy. However, other people require a combination of therapies.   This information is not intended to replace advice given to you by your health care provider. Make sure you discuss any questions you have with your health care provider.   Document Released: 10/15/2012 Document Revised: 07/11/2014 Document Reviewed: 10/15/2012 Elsevier Interactive Patient Education [employer].

## 2015-05-14 NOTE — Progress Notes (Signed)
Patient: Lisa Ashley Female    DOB: [birth year]   49 y.o.   MRN: [id number] Visit Date: 05/14/2015  Today's Provider: Mar Daring, PA-C   Chief Complaint  Patient presents with  . Anxiety   Subjective:    Anxiety Presents for follow-up visit. Onset was 1 to 4 weeks ago. Symptoms include decreased concentration and nervous/anxious behavior. Patient reports no depressed mood, dizziness, dry mouth, excessive worry, hyperventilation or irritability. Symptoms occur occasionally (had one last week). The severity of symptoms is mild. The quality of sleep is good. Nighttime awakenings: none.   The treatment provided moderate relief. Compliance with prior treatments has been good. Compliance with medications is 76-100%.   She does report improvement in her anxiety symptoms including fear of going out in public which she used to have. She use to avoid going to outings with friends because of fear that they may judge the way she looks. The Lexapro has helped this and she states that she does not have that issue any longer. She does still use Xanax as needed. She states she is currently taking one Xanax daily which she takes at night to help her sleep and then if needed she may take another throughout the day. She is continuing to try to cut back on her use of Xanax.  She also does continue to have some discomfort of the left lateral ankle. This is still most likely consistent with tendinitis. 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 This has helped the symptoms quite a bit area she also states the meloxicam has helped. She does not take meloxicam daily she only takes as needed when her ankle is sore after a nights work.  Of note she will also be getting her physical done with Dr. Marcelline Mates at [facility] November 28.    Allergies  Allergen Reactions  . Codeine Nausea And Vomiting   Previous Medications   ALPRAZOLAM (XANAX)  0.5 MG TABLET    Take 1 tablet (0.5 mg total) by mouth 3 (three) times daily as needed for anxiety.   CHOLECALCIFEROL (VITAMIN D3) 2000 UNITS TABS    Take 1 tablet by mouth daily.   ESCITALOPRAM (LEXAPRO) 10 MG TABLET    Take 10 mg by mouth daily.   FERROUS SULFATE (IRON SUPPLEMENT) 325 (65 FE) MG TABLET    Take 1 tablet by mouth daily.   GREEN TEA, CAMILLIA SINENSIS, PO    Take 1 tablet by mouth daily.   MELOXICAM (MOBIC) 15 MG TABLET    Take 1 tablet (15 mg total) by mouth daily.   MULTIPLE VITAMIN PO    Take 1 tablet by mouth daily.   OMEGA-3 FATTY ACIDS (FISH OIL CONCENTRATE) 435 MG CAPS    Take 1 capsule by mouth daily.   OMEGA-3 FATTY ACIDS (FISH OIL PO)    Take 1,000 mg by mouth daily.   OVER THE COUNTER MEDICATION    Take by mouth 1 day or 1 dose.   VITAMIN E 400 UNIT CAPSULE    Take 200 Units by mouth daily.   ZINC SULFATE (ZINC 15 PO)    Take 1 tablet by mouth daily.    Review of Systems  Constitutional: Negative.  Negative for irritability.  Respiratory: Negative.   Cardiovascular: Negative.   Gastrointestinal: Negative.   Musculoskeletal: Positive for arthralgias (left ankle).  Neurological: Negative for dizziness.  Psychiatric/Behavioral: Positive for  decreased concentration. The patient is nervous/anxious.   All other systems reviewed and are negative.   Social History  Substance Use Topics  . Smoking status: Former Smoker -- 1.00 packs/day for 5 years    Types: Cigarettes  . Smokeless tobacco: Not on file     Comment: QUIT IN 2011  . Alcohol Use: No   Objective:   BP 130/60 mmHg  Pulse 72  Temp(Src) 98.4 F (36.9 C) (Oral)  Resp 16  Wt 211 lb (95.709 kg)  Physical Exam  Constitutional: She appears well-developed and well-nourished. No distress.  Cardiovascular: Normal rate, regular rhythm and normal heart sounds.  Exam reveals no gallop and no friction rub.   No murmur heard. Pulmonary/Chest: Effort normal and breath sounds normal. No respiratory distress.  She has no wheezes. She has no rales.  Musculoskeletal: Normal range of motion. She exhibits tenderness. She exhibits no edema.       Left ankle: She exhibits normal range of motion and no swelling. Tenderness.       Feet:  Skin: She is not diaphoretic.  Psychiatric: She has a normal mood and affect. Her behavior is normal. Judgment and thought content normal.  Vitals reviewed.       Assessment & Plan:     1. Anxiety Improving. She continues to have good control of her symptoms with taking Lexapro 10 mg daily. She uses Xanax as needed when she has any breakthrough panic attacks. She will also use Xanax daily at night to help her sleep. This has been helping and she does feel she is doing much better. These medications were refilled as below. I will follow-up with her in 6 months to make sure that she is still doing well with the medications and to adjust if necessary. - escitalopram (LEXAPRO) 10 MG tablet; Take 1 tablet (10 mg total) by mouth daily.  Dispense: 30 tablet; Refill: 6 - ALPRAZolam (XANAX) 0.5 MG tablet; Take 1 tablet (0.5 mg total) by mouth 2 (two) times daily as needed for anxiety.  Dispense: 60 tablet; Refill: 1  2. Tendinitis of ankle Improving. 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 She also has been wearing hightop sneakers when she works her second job to give more support. I did advise that she may also benefit from a ankle brace for that extra support. Continue elevation as needed. Continue meloxicam as needed. Meloxicam was refilled as below. She is to call the office if her symptoms worsen. At that time we may consider referral to orthopedics. She does not wish to go to orthopedics at this time as she is scared that they will make her quit working her second job for a short time. 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 - meloxicam (MOBIC) 15 MG tablet; Take 1 tablet (15 mg total) by mouth daily.  Dispense: 30 tablet; Refill: 1  3. Depression See above medical treatment plan for anxiety. - escitalopram (LEXAPRO) 10 MG tablet; Take 1 tablet (10 mg total) by mouth daily.  Dispense: 30 tablet; Refill: [place], PA-C  [employer]

## 2015-06-02 ENCOUNTER — Ambulatory Visit (INDEPENDENT_AMBULATORY_CARE_PROVIDER_SITE_OTHER): Payer: PRIVATE HEALTH INSURANCE | Admitting: Obstetrics and Gynecology

## 2015-06-02 ENCOUNTER — Encounter: Payer: Self-pay | Admitting: Obstetrics and Gynecology

## 2015-06-02 VITALS — BP 127/68 | HR 64 | Ht 71.0 in | Wt 210.7 lb

## 2015-06-02 DIAGNOSIS — Z1239 Encounter for other screening for malignant neoplasm of breast: Secondary | ICD-10-CM

## 2015-06-02 DIAGNOSIS — Z124 Encounter for screening for malignant neoplasm of cervix: Secondary | ICD-10-CM | POA: Diagnosis not present

## 2015-06-02 DIAGNOSIS — Z8742 Personal history of other diseases of the female genital tract: Secondary | ICD-10-CM | POA: Diagnosis not present

## 2015-06-02 DIAGNOSIS — Z01419 Encounter for gynecological examination (general) (routine) without abnormal findings: Secondary | ICD-10-CM

## 2015-06-02 DIAGNOSIS — Z113 Encounter for screening for infections with a predominantly sexual mode of transmission: Secondary | ICD-10-CM

## 2015-06-02 NOTE — Patient Instructions (Signed)
Preventive Care for Adults, Female A healthy lifestyle and preventive care can promote health and wellness. Preventive health guidelines for women include the following key practices.  A routine yearly physical is a good way to check with your health care provider about your health and preventive screening. It is a chance to share any concerns and updates on your health and to receive a thorough exam.  Visit your dentist for a routine exam and preventive care every 6 months. Brush your teeth twice a day and floss once a day. Good oral hygiene prevents tooth decay and gum disease.  The frequency of eye exams is based on your age, health, family medical history, use of contact lenses, and other factors. Follow your health care provider's recommendations for frequency of eye exams.  Eat a healthy diet. Foods like vegetables, fruits, whole grains, low-fat dairy products, and lean protein foods contain the nutrients you need without too many calories. Decrease your intake of foods high in solid fats, added sugars, and salt. Eat the right amount of calories for you.Get information about a proper diet from your health care provider, if necessary.  Regular physical exercise is one of the most important things you can do for your health. Most adults should get at least 150 minutes of moderate-intensity exercise (any activity that increases your heart rate and causes you to sweat) each week. In addition, most adults need muscle-strengthening exercises on 2 or more days a week.  Maintain a healthy weight. The body mass index (BMI) is a screening tool to identify possible weight problems. It provides an estimate of body fat based on height and weight. Your health care provider can find your BMI and can help you achieve or maintain a healthy weight.For adults 20 years and older:  A BMI below 18.5 is considered underweight.  A BMI of 18.5 to 24.9 is normal.  A BMI of 25 to 29.9 is considered  overweight.  A BMI of 30 and above is considered obese.  Maintain normal blood lipids and cholesterol levels by exercising and minimizing your intake of saturated fat. Eat a balanced diet with plenty of fruit and vegetables. Blood tests for lipids and cholesterol should begin at age 64 and be repeated every 5 years. If your lipid or cholesterol levels are high, you are over 50, or you are at high risk for heart disease, you may need your cholesterol levels checked more frequently.Ongoing high lipid and cholesterol levels should be treated with medicines if diet and exercise are not working.  If you smoke, find out from your health care provider how to quit. If you do not use tobacco, do not start.  Lung cancer screening is recommended for adults aged 52-80 years who are at high risk for developing lung cancer because of a history of smoking. A yearly low-dose CT scan of the lungs is recommended for people who have at least a 30-pack-year history of smoking and are a current smoker or have quit within the past 15 years. A pack year of smoking is smoking an average of 1 pack of cigarettes a day for 1 year (for example: 1 pack a day for 30 years or 2 packs a day for 15 years). Yearly screening should continue until the smoker has stopped smoking for at least 15 years. Yearly screening should be stopped for people who develop a health problem that would prevent them from having lung cancer treatment.  If you are pregnant, do not drink alcohol. If you are  breastfeeding, be very cautious about drinking alcohol. If you are not pregnant and choose to drink alcohol, do not have more than 1 drink per day. One drink is considered to be 12 ounces (355 mL) of beer, 5 ounces (148 mL) of wine, or 1.5 ounces (44 mL) of liquor.  Avoid use of street drugs. Do not share needles with anyone. Ask for help if you need support or instructions about stopping the use of drugs.  High blood pressure causes heart disease and  increases the risk of stroke. Your blood pressure should be checked at least every 1 to 2 years. Ongoing high blood pressure should be treated with medicines if weight loss and exercise do not work.  If you are 25-78 years old, ask your health care provider if you should take aspirin to prevent strokes.  Diabetes screening is done by taking a blood sample to check your blood glucose level after you have not eaten for a certain period of time (fasting). If you are not overweight and you do not have risk factors for diabetes, you should be screened once every 3 years starting at age 86. If you are overweight or obese and you are 3-87 years of age, you should be screened for diabetes every year as part of your cardiovascular risk assessment.  Breast cancer screening is essential preventive care for women. You should practice "breast self-awareness." This means understanding the normal appearance and feel of your breasts and may include breast self-examination. Any changes detected, no matter how small, should be reported to a health care provider. Women in their 66s and 30s should have a clinical breast exam (CBE) by a health care provider as part of a regular health exam every 1 to 3 years. After age 43, women should have a CBE every year. Starting at age 37, women should consider having a mammogram (breast X-ray test) every year. Women who have a family history of breast cancer should talk to their health care provider about genetic screening. Women at a high risk of breast cancer should talk to their health care providers about having an MRI and a mammogram every year.  Breast cancer gene (BRCA)-related cancer risk assessment is recommended for women who have family members with BRCA-related cancers. BRCA-related cancers include breast, ovarian, tubal, and peritoneal cancers. Having family members with these cancers may be associated with an increased risk for harmful changes (mutations) in the breast  cancer genes BRCA1 and BRCA2. Results of the assessment will determine the need for genetic counseling and BRCA1 and BRCA2 testing.  Your health care provider may recommend that you be screened regularly for cancer of the pelvic organs (ovaries, uterus, and vagina). This screening involves a pelvic examination, including checking for microscopic changes to the surface of your cervix (Pap test). You may be encouraged to have this screening done every 3 years, beginning at age 78.  For women ages 79-65, health care providers may recommend pelvic exams and Pap testing every 3 years, or they may recommend the Pap and pelvic exam, combined with testing for human papilloma virus (HPV), every 5 years. Some types of HPV increase your risk of cervical cancer. Testing for HPV may also be done on women of any age with unclear Pap test results.  Other health care providers may not recommend any screening for nonpregnant women who are considered low risk for pelvic cancer and who do not have symptoms. Ask your health care provider if a screening pelvic exam is right for  you.  If you have had past treatment for cervical cancer or a condition that could lead to cancer, you need Pap tests and screening for cancer for at least 20 years after your treatment. If Pap tests have been discontinued, your risk factors (such as having a new sexual partner) need to be reassessed to determine if screening should resume. Some women have medical problems that increase the chance of getting cervical cancer. In these cases, your health care provider may recommend more frequent screening and Pap tests.  Colorectal cancer can be detected and often prevented. Most routine colorectal cancer screening begins at the age of 50 years and continues through age 75 years. However, your health care provider may recommend screening at an earlier age if you have risk factors for colon cancer. On a yearly basis, your health care provider may provide  home test kits to check for hidden blood in the stool. Use of a small camera at the end of a tube, to directly examine the colon (sigmoidoscopy or colonoscopy), can detect the earliest forms of colorectal cancer. Talk to your health care provider about this at age 50, when routine screening begins. Direct exam of the colon should be repeated every 5-10 years through age 75 years, unless early forms of precancerous polyps or small growths are found.  People who are at an increased risk for hepatitis B should be screened for this virus. You are considered at high risk for hepatitis B if:  You were born in a country where hepatitis B occurs often. Talk with your health care provider about which countries are considered high risk.  Your parents were born in a high-risk country and you have not received a shot to protect against hepatitis B (hepatitis B vaccine).  You have HIV or AIDS.  You use needles to inject street drugs.  You live with, or have sex with, someone who has hepatitis B.  You get hemodialysis treatment.  You take certain medicines for conditions like cancer, organ transplantation, and autoimmune conditions.  Hepatitis C blood testing is recommended for all people born from 1945 through 1965 and any individual with known risks for hepatitis C.  Practice safe sex. Use condoms and avoid high-risk sexual practices to reduce the spread of sexually transmitted infections (STIs). STIs include gonorrhea, chlamydia, syphilis, trichomonas, herpes, HPV, and human immunodeficiency virus (HIV). Herpes, HIV, and HPV are viral illnesses that have no cure. They can result in disability, cancer, and death.  You should be screened for sexually transmitted illnesses (STIs) including gonorrhea and chlamydia if:  You are sexually active and are younger than 24 years.  You are older than 24 years and your health care provider tells you that you are at risk for this type of infection.  Your sexual  activity has changed since you were last screened and you are at an increased risk for chlamydia or gonorrhea. Ask your health care provider if you are at risk.  If you are at risk of being infected with HIV, it is recommended that you take a prescription medicine daily to prevent HIV infection. This is called preexposure prophylaxis (PrEP). You are considered at risk if:  You are sexually active and do not regularly use condoms or know the HIV status of your partner(s).  You take drugs by injection.  You are sexually active with a partner who has HIV.  Talk with your health care provider about whether you are at high risk of being infected with HIV. If   you choose to begin PrEP, you should first be tested for HIV. You should then be tested every 3 months for as long as you are taking PrEP.  Osteoporosis is a disease in which the bones lose minerals and strength with aging. This can result in serious bone fractures or breaks. The risk of osteoporosis can be identified using a bone density scan. Women ages 1 years and over and women at risk for fractures or osteoporosis should discuss screening with their health care providers. Ask your health care provider whether you should take a calcium supplement or vitamin D to reduce the rate of osteoporosis.  Menopause can be associated with physical symptoms and risks. Hormone replacement therapy is available to decrease symptoms and risks. You should talk to your health care provider about whether hormone replacement therapy is right for you.  Use sunscreen. Apply sunscreen liberally and repeatedly throughout the day. You should seek shade when your shadow is shorter than you. Protect yourself by wearing long sleeves, pants, a wide-brimmed hat, and sunglasses year round, whenever you are outdoors.  Once a month, do a whole body skin exam, using a mirror to look at the skin on your back. Tell your health care provider of new moles, moles that have irregular  borders, moles that are larger than a pencil eraser, or moles that have changed in shape or color.  Stay current with required vaccines (immunizations).  Influenza vaccine. All adults should be immunized every year.  Tetanus, diphtheria, and acellular pertussis (Td, Tdap) vaccine. Pregnant women should receive 1 dose of Tdap vaccine during each pregnancy. The dose should be obtained regardless of the length of time since the last dose. Immunization is preferred during the 27th-36th week of gestation. An adult who has not previously received Tdap or who does not know her vaccine status should receive 1 dose of Tdap. This initial dose should be followed by tetanus and diphtheria toxoids (Td) booster doses every 10 years. Adults with an unknown or incomplete history of completing a 3-dose immunization series with Td-containing vaccines should begin or complete a primary immunization series including a Tdap dose. Adults should receive a Td booster every 10 years.  Varicella vaccine. An adult without evidence of immunity to varicella should receive 2 doses or a second dose if she has previously received 1 dose. Pregnant females who do not have evidence of immunity should receive the first dose after pregnancy. This first dose should be obtained before leaving the health care facility. The second dose should be obtained 4-8 weeks after the first dose.  Human papillomavirus (HPV) vaccine. Females aged 13-26 years who have not received the vaccine previously should obtain the 3-dose series. The vaccine is not recommended for use in pregnant females. However, pregnancy testing is not needed before receiving a dose. If a female is found to be pregnant after receiving a dose, no treatment is needed. In that case, the remaining doses should be delayed until after the pregnancy. Immunization is recommended for any person with an immunocompromised condition through the age of 24 years if she did not get any or all doses  earlier. During the 3-dose series, the second dose should be obtained 4-8 weeks after the first dose. The third dose should be obtained 24 weeks after the first dose and 16 weeks after the second dose.  Zoster vaccine. One dose is recommended for adults aged 97 years or older unless certain conditions are present.  Measles, mumps, and rubella (MMR) vaccine. Adults born  before 1957 generally are considered immune to measles and mumps. Adults born in [birth year] or later should have 1 or more doses of MMR vaccine unless there is a contraindication to the vaccine or there is laboratory evidence of immunity to each of the three diseases. A routine second dose of MMR vaccine should be obtained at least 28 days after the first dose for students attending postsecondary schools, health care workers, or international travelers. People who received inactivated measles vaccine or an unknown type of measles vaccine during 1963-1967 should receive 2 doses of MMR vaccine. People who received inactivated mumps vaccine or an unknown type of mumps vaccine before 1979 and are at high risk for mumps infection should consider immunization with 2 doses of MMR vaccine. For females of childbearing age, rubella immunity should be determined. If there is no evidence of immunity, females who are not pregnant should be vaccinated. If there is no evidence of immunity, females who are pregnant should delay immunization until after pregnancy. Unvaccinated health care workers born before 60 who lack laboratory evidence of measles, mumps, or rubella immunity or laboratory confirmation of disease should consider measles and mumps immunization with 2 doses of MMR vaccine or rubella immunization with 1 dose of MMR vaccine.  Pneumococcal 13-valent conjugate (PCV13) vaccine. When indicated, a person who is uncertain of his immunization history and has no record of immunization should receive the PCV13 vaccine. All adults 61 years of age and older  should receive this vaccine. An adult aged 92 years or older who has certain medical conditions and has not been previously immunized should receive 1 dose of PCV13 vaccine. This PCV13 should be followed with a dose of pneumococcal polysaccharide (PPSV23) vaccine. Adults who are at high risk for pneumococcal disease should obtain the PPSV23 vaccine at least 8 weeks after the dose of PCV13 vaccine. Adults older than 48 years of age who have normal immune system function should obtain the PPSV23 vaccine dose at least 1 year after the dose of PCV13 vaccine.  Pneumococcal polysaccharide (PPSV23) vaccine. When PCV13 is also indicated, PCV13 should be obtained first. All adults aged 2 years and older should be immunized. An adult younger than age 30 years who has certain medical conditions should be immunized. Any person who resides in a nursing home or long-term care facility should be immunized. An adult smoker should be immunized. People with an immunocompromised condition and certain other conditions should receive both PCV13 and PPSV23 vaccines. People with human immunodeficiency virus (HIV) infection should be immunized as soon as possible after diagnosis. Immunization during chemotherapy or radiation therapy should be avoided. Routine use of PPSV23 vaccine is not recommended for American Indians, [place] Natives, or people younger than 65 years unless there are medical conditions that require PPSV23 vaccine. When indicated, people who have unknown immunization and have no record of immunization should receive PPSV23 vaccine. One-time revaccination 5 years after the first dose of PPSV23 is recommended for people aged 19-64 years who have chronic kidney failure, nephrotic syndrome, asplenia, or immunocompromised conditions. People who received 1-2 doses of PPSV23 before age 44 years should receive another dose of PPSV23 vaccine at age 83 years or later if at least 5 years have passed since the previous dose. Doses  of PPSV23 are not needed for people immunized with PPSV23 at or after age 20 years.  Meningococcal vaccine. Adults with asplenia or persistent complement component deficiencies should receive 2 doses of quadrivalent meningococcal conjugate (MenACWY-D) vaccine. The doses should be obtained  at least 2 months apart. Microbiologists working with certain meningococcal bacteria, [place] recruits, people at risk during an outbreak, and people who travel to or live in countries with a high rate of meningitis should be immunized. A first-year college student up through age 28 years who is living in a residence hall should receive a dose if she did not receive a dose on or after her 16th birthday. Adults who have certain high-risk conditions should receive one or more doses of vaccine.  Hepatitis A vaccine. Adults who wish to be protected from this disease, have certain high-risk conditions, work with hepatitis A-infected animals, work in hepatitis A research labs, or travel to or work in countries with a high rate of hepatitis A should be immunized. Adults who were previously unvaccinated and who anticipate close contact with an international adoptee during the first 60 days after arrival in the [place] from a country with a high rate of hepatitis A should be immunized.  Hepatitis B vaccine. Adults who wish to be protected from this disease, have certain high-risk conditions, may be exposed to blood or other infectious body fluids, are household contacts or sex partners of hepatitis B positive people, are clients or workers in certain care facilities, or travel to or work in countries with a high rate of hepatitis B should be immunized.  Haemophilus influenzae type b (Hib) vaccine. A previously unvaccinated person with asplenia or sickle cell disease or having a scheduled splenectomy should receive 1 dose of Hib vaccine. Regardless of previous immunization, a recipient of a hematopoietic stem cell transplant  should receive a 3-dose series 6-12 months after her successful transplant. Hib vaccine is not recommended for adults with HIV infection. Preventive Services / Frequency Ages 71 to 87 years  Blood pressure check.** / Every 3-5 years.  Lipid and cholesterol check.** / Every 5 years beginning at age 1.  Clinical breast exam.** / Every 3 years for women in their 3s and 31s.  BRCA-related cancer risk assessment.** / For women who have family members with a BRCA-related cancer (breast, ovarian, tubal, or peritoneal cancers).  Pap test.** / Every 2 years from ages 50 through 86. Every 3 years starting at age 87 through age 7 or 75 with a history of 3 consecutive normal Pap tests.  HPV screening.** / Every 3 years from ages 59 through ages 35 to 6 with a history of 3 consecutive normal Pap tests.  Hepatitis C blood test.** / For any individual with known risks for hepatitis C.  Skin self-exam. / Monthly.  Influenza vaccine. / Every year.  Tetanus, diphtheria, and acellular pertussis (Tdap, Td) vaccine.** / Consult your health care provider. Pregnant women should receive 1 dose of Tdap vaccine during each pregnancy. 1 dose of Td every 10 years.  Varicella vaccine.** / Consult your health care provider. Pregnant females who do not have evidence of immunity should receive the first dose after pregnancy.  HPV vaccine. / 3 doses over 6 months, if 72 and younger. The vaccine is not recommended for use in pregnant females. However, pregnancy testing is not needed before receiving a dose.  Measles, mumps, rubella (MMR) vaccine.** / You need at least 1 dose of MMR if you were born in 1957 or later. You may also need a 2nd dose. For females of childbearing age, rubella immunity should be determined. If there is no evidence of immunity, females who are not pregnant should be vaccinated. If there is no evidence of immunity, females who are  pregnant should delay immunization until after  pregnancy.  Pneumococcal 13-valent conjugate (PCV13) vaccine.** / Consult your health care provider.  Pneumococcal polysaccharide (PPSV23) vaccine.** / 1 to 2 doses if you smoke cigarettes or if you have certain conditions.  Meningococcal vaccine.** / 1 dose if you are age 87 to 44 years and a Market researcher living in a residence hall, or have one of several medical conditions, you need to get vaccinated against meningococcal disease. You may also need additional booster doses.  Hepatitis A vaccine.** / Consult your health care provider.  Hepatitis B vaccine.** / Consult your health care provider.  Haemophilus influenzae type b (Hib) vaccine.** / Consult your health care provider. Ages 86 to 38 years  Blood pressure check.** / Every year.  Lipid and cholesterol check.** / Every 5 years beginning at age 49 years.  Lung cancer screening. / Every year if you are aged 71-80 years and have a 30-pack-year history of smoking and currently smoke or have quit within the past 15 years. Yearly screening is stopped once you have quit smoking for at least 15 years or develop a health problem that would prevent you from having lung cancer treatment.  Clinical breast exam.** / Every year after age 51 years.  BRCA-related cancer risk assessment.** / For women who have family members with a BRCA-related cancer (breast, ovarian, tubal, or peritoneal cancers).  Mammogram.** / Every year beginning at age 18 years and continuing for as long as you are in good health. Consult with your health care provider.  Pap test.** / Every 3 years starting at age 63 years through age 37 or 57 years with a history of 3 consecutive normal Pap tests.  HPV screening.** / Every 3 years from ages 41 years through ages 76 to 23 years with a history of 3 consecutive normal Pap tests.  Fecal occult blood test (FOBT) of stool. / Every year beginning at age 36 years and continuing until age 51 years. You may not need  to do this test if you get a colonoscopy every 10 years.  Flexible sigmoidoscopy or colonoscopy.** / Every 5 years for a flexible sigmoidoscopy or every 10 years for a colonoscopy beginning at age 36 years and continuing until age 35 years.  Hepatitis C blood test.** / For all people born from [birth year] through 1965 and any individual with known risks for hepatitis C.  Skin self-exam. / Monthly.  Influenza vaccine. / Every year.  Tetanus, diphtheria, and acellular pertussis (Tdap/Td) vaccine.** / Consult your health care provider. Pregnant women should receive 1 dose of Tdap vaccine during each pregnancy. 1 dose of Td every 10 years.  Varicella vaccine.** / Consult your health care provider. Pregnant females who do not have evidence of immunity should receive the first dose after pregnancy.  Zoster vaccine.** / 1 dose for adults aged 73 years or older.  Measles, mumps, rubella (MMR) vaccine.** / You need at least 1 dose of MMR if you were born in 1957 or later. You may also need a second dose. For females of childbearing age, rubella immunity should be determined. If there is no evidence of immunity, females who are not pregnant should be vaccinated. If there is no evidence of immunity, females who are pregnant should delay immunization until after pregnancy.  Pneumococcal 13-valent conjugate (PCV13) vaccine.** / Consult your health care provider.  Pneumococcal polysaccharide (PPSV23) vaccine.** / 1 to 2 doses if you smoke cigarettes or if you have certain conditions.  Meningococcal vaccine.** /  Consult your health care provider.  Hepatitis A vaccine.** / Consult your health care provider.  Hepatitis B vaccine.** / Consult your health care provider.  Haemophilus influenzae type b (Hib) vaccine.** / Consult your health care provider. Ages 80 years and over  Blood pressure check.** / Every year.  Lipid and cholesterol check.** / Every 5 years beginning at age 62 years.  Lung cancer  screening. / Every year if you are aged 32-80 years and have a 30-pack-year history of smoking and currently smoke or have quit within the past 15 years. Yearly screening is stopped once you have quit smoking for at least 15 years or develop a health problem that would prevent you from having lung cancer treatment.  Clinical breast exam.** / Every year after age 61 years.  BRCA-related cancer risk assessment.** / For women who have family members with a BRCA-related cancer (breast, ovarian, tubal, or peritoneal cancers).  Mammogram.** / Every year beginning at age 39 years and continuing for as long as you are in good health. Consult with your health care provider.  Pap test.** / Every 3 years starting at age 85 years through age 74 or 72 years with 3 consecutive normal Pap tests. Testing can be stopped between 65 and 70 years with 3 consecutive normal Pap tests and no abnormal Pap or HPV tests in the past 10 years.  HPV screening.** / Every 3 years from ages 55 years through ages 67 or 77 years with a history of 3 consecutive normal Pap tests. Testing can be stopped between 65 and 70 years with 3 consecutive normal Pap tests and no abnormal Pap or HPV tests in the past 10 years.  Fecal occult blood test (FOBT) of stool. / Every year beginning at age 81 years and continuing until age 22 years. You may not need to do this test if you get a colonoscopy every 10 years.  Flexible sigmoidoscopy or colonoscopy.** / Every 5 years for a flexible sigmoidoscopy or every 10 years for a colonoscopy beginning at age 67 years and continuing until age 22 years.  Hepatitis C blood test.** / For all people born from [birth year] through 1964 and any individual with known risks for hepatitis C.  Osteoporosis screening.** / A one-time screening for women ages 8 years and over and women at risk for fractures or osteoporosis.  Skin self-exam. / Monthly.  Influenza vaccine. / Every year.  Tetanus, diphtheria, and  acellular pertussis (Tdap/Td) vaccine.** / 1 dose of Td every 10 years.  Varicella vaccine.** / Consult your health care provider.  Zoster vaccine.** / 1 dose for adults aged 56 years or older.  Pneumococcal 13-valent conjugate (PCV13) vaccine.** / Consult your health care provider.  Pneumococcal polysaccharide (PPSV23) vaccine.** / 1 dose for all adults aged 15 years and older.  Meningococcal vaccine.** / Consult your health care provider.  Hepatitis A vaccine.** / Consult your health care provider.  Hepatitis B vaccine.** / Consult your health care provider.  Haemophilus influenzae type b (Hib) vaccine.** / Consult your health care provider. ** Family history and personal history of risk and conditions may change your health care provider's recommendations.   This information is not intended to replace advice given to you by your health care provider. Make sure you discuss any questions you have with your health care provider.   Document Released: 08/16/2001 Document Revised: 07/11/2014 Document Reviewed: 11/15/2010 Elsevier Interactive Patient Education [employer].

## 2015-06-02 NOTE — Progress Notes (Signed)
GYNECOLOGY ANNUAL EXAM CLINIC PROGRESS NOTE  Subjective:    Lisa Ashley is a 49 y.o. G39P1001 female who presents for an annual exam. The patient has no complaints today. The patient is sexually active. GYN screening history: last pap: approximate date 05/2014 and was normal and last mammogram: approximate date 03/2014 and was normal. The patient wears seatbelts: yes. The patient participates in regular exercise: no. Has the patient ever been transfused or tattooed?: yes (multiple tattoos). The patient reports that there is not domestic violence in her life.   Menstrual History: Obstetric History   G1   P1   T1   P0   A0   TAB0   SAB0   E0   M0   L1     # Outcome Date GA Lbr Len/2nd Weight Sex Delivery Anes PTL Lv  1 Term 1991   8 lb 6 oz (3.799 kg)  CS-LTranv         Menarche age: 50 No LMP recorded. Patient has had a hysterectomy.  H/o abnormal pap smear in 2014 (ASCUS HR HPV) followed by colposcopy.  Subusequent pap in 2015 normal.  Denies h/o STIs.  Denies h/o abnormal mammograms.     History reviewed. No pertinent past medical history.  Family History  Problem Relation Age of Onset  . Breast cancer Mother   . Heart attack Father   . Stroke Brother   . Cancer Maternal Grandmother     Past Surgical History  Procedure Laterality Date  . Cesarean section  1991  . Laparoscopic supracervical hysterectomy  2012  . Right oophorectomy  2012    Social History   Social History  . Marital Status: Single    Spouse Name: N/A  . Number of Children: N/A  . Years of Education: N/A   Occupational History  . Not on file.   Social History Main Topics  . Smoking status: Former Smoker -- 1.00 packs/day for 5 years    Types: Cigarettes  . Smokeless tobacco: Not on file     Comment: QUIT IN 2011  . Alcohol Use: No  . Drug Use: No  . Sexual Activity: Not Currently    Birth Control/ Protection: Surgical   Other Topics Concern  . Not on file   Social History Narrative     Current Outpatient Prescriptions on File Prior to Visit  Medication Sig Dispense Refill  . ALPRAZolam (XANAX) 0.5 MG tablet Take 1 tablet (0.5 mg total) by mouth 2 (two) times daily as needed for anxiety. 60 tablet 1  . Cholecalciferol (VITAMIN D3) 2000 UNITS TABS Take 1 tablet by mouth daily.    Marland Kitchen escitalopram (LEXAPRO) 10 MG tablet Take 1 tablet (10 mg total) by mouth daily. 30 tablet 6  . ferrous sulfate (IRON SUPPLEMENT) 325 (65 FE) MG tablet Take 1 tablet by mouth daily.    Marland Kitchen GREEN TEA, CAMILLIA SINENSIS, PO Take 1 tablet by mouth daily.    . meloxicam (MOBIC) 15 MG tablet Take 1 tablet (15 mg total) by mouth daily. 30 tablet 1  . MULTIPLE VITAMIN PO Take 1 tablet by mouth daily.    . Omega-3 Fatty Acids (FISH OIL CONCENTRATE) 435 MG CAPS Take 1 capsule by mouth daily.    . Omega-3 Fatty Acids (FISH OIL PO) Take 1,000 mg by mouth daily.    Marland Kitchen OVER THE COUNTER MEDICATION Take by mouth 1 day or 1 dose.    . vitamin E 400 UNIT capsule Take 200 Units by  mouth daily.    . Zinc Sulfate (ZINC 15 PO) Take 1 tablet by mouth daily.     No current facility-administered medications on file prior to visit.     Allergies  Allergen Reactions  . Codeine Nausea And Vomiting     Review of Systems Constitutional: negative for chills, fatigue, fevers and sweats Eyes: negative for irritation, redness and visual disturbance Ears, nose, mouth, throat, and face: negative for hearing loss, nasal congestion, snoring and tinnitus Respiratory: negative for asthma, cough, sputum Cardiovascular: negative for chest pain, dyspnea, exertional chest pressure/discomfort, irregular heart beat, palpitations and syncope Gastrointestinal: negative for abdominal pain, change in bowel habits, nausea and vomiting Genitourinary: negative for abnormal menstrual periods, genital lesions, sexual problems and vaginal discharge, dysuria and urinary incontinence Integument/breast: negative for breast lump, breast  tenderness and nipple discharge Hematologic/lymphatic: negative for bleeding and easy bruising Musculoskeletal:negative for back pain and muscle weakness Neurological: negative for dizziness, headaches, vertigo and weakness Endocrine: negative for diabetic symptoms including polydipsia, polyuria and skin dryness Allergic/Immunologic: negative for hay fever and urticaria     Objective:    BP 127/68 mmHg  Pulse 64  Ht 5\' 11"  (1.803 m)  Wt 210 lb 11.2 oz (95.573 kg)  BMI 29.40 kg/m2.     General Appearance:    Alert, cooperative, no distress, appears stated age  Head:    Normocephalic, without obvious abnormality, atraumatic  Eyes:    PERRL, conjunctiva/corneas clear, EOM's intact, both eyes  Ears:    Normal external ear canals, both ears  Nose:   Nares normal, septum midline, mucosa normal, no drainage or sinus tenderness  Throat:   Lips, mucosa, and tongue normal; teeth and gums normal  Neck:   Supple, symmetrical, trachea midline, no adenopathy; thyroid: no enlargement/tenderness/nodules; no carotid bruit or JVD  Back:     Symmetric, no curvature, ROM normal, no CVA tenderness  Lungs:     Clear to auscultation bilaterally, respirations unlabored  Chest Wall:    No tenderness or deformity   Heart:    Regular rate and rhythm, S1 and S2 normal, no murmur, rub or gallop  Breast Exam:    No tenderness, masses, or nipple abnormality  Abdomen:     Soft, non-tender, bowel sounds active all four quadrants, no masses, no organomegaly.  Genitalia:    Pelvic:external genitalia normal, vagina without lesions, discharge, or tenderness; rectovaginal septum  normal. Cervix normal in appearance, os slightly stenotic, no cervical motion tenderness.  Uterus surgically absent. Right  ovary surgically absent.  Bilateral adnexa without adnexal masses or tenderness.  Rectal:    Normal external sphincter.  No hemorrhoids appreciated. Internal exam not done.   Extremities:   Extremities normal, atraumatic, no  cyanosis or edema  Pulses:   2+ and symmetric all extremities  Skin:   Skin color, texture, turgor normal, no rashes or lesions  Lymph nodes:   Cervical, supraclavicular, and axillary nodes normal  Neurologic:   CNII-XII intact, normal strength, sensation and reflexes throughout       Assessment:    Healthy female exam.   Overweight H/o abnormal pap smear  Plan:     Blood tests: CBC with diff, Comprehensive metabolic panel and random glucose.  TSH, lipid panel, HgbA1c performed last year, wnl. Breast self exam technique reviewed and patient encouraged to perform self-exam monthly. Contraception: status post hysterectomy. Discussed healthy lifestyle modifications. Mammogram ordered. Pap smear performed.  To repeat again next year due to h/o abnormal pap in 2014.  If next pap normal, can resume routine screening (q 3-5 years) Desires STI testing, ordered.  Flu vaccine given today.    Rubie Maid, MD [facility]

## 2015-06-03 LAB — COMPREHENSIVE METABOLIC PANEL
A/G RATIO: 1.7 (ref 1.1–2.5)
ALK PHOS: 75 IU/L (ref 39–117)
ALT: 21 IU/L (ref 0–32)
AST: 19 IU/L (ref 0–40)
Albumin: 4.8 g/dL (ref 3.5–5.5)
BILIRUBIN TOTAL: 0.4 mg/dL (ref 0.0–1.2)
BUN/Creatinine Ratio: 21 (ref 9–23)
BUN: 11 mg/dL (ref 6–24)
CALCIUM: 9.5 mg/dL (ref 8.7–10.2)
CHLORIDE: 101 mmol/L (ref 97–106)
CO2: 21 mmol/L (ref 18–29)
Creatinine, Ser: 0.53 mg/dL — ABNORMAL LOW (ref 0.57–1.00)
GFR calc Af Amer: 130 mL/min/{1.73_m2} (ref >59)
GFR, EST NON AFRICAN AMERICAN: 113 mL/min/{1.73_m2} (ref >59)
Globulin, Total: 2.9 g/dL (ref 1.5–4.5)
Glucose: 85 mg/dL (ref 65–99)
POTASSIUM: 4.1 mmol/L (ref 3.5–5.2)
Sodium: 141 mmol/L (ref 136–144)
Total Protein: 7.7 g/dL (ref 6.0–8.5)

## 2015-06-05 ENCOUNTER — Telehealth: Payer: Self-pay

## 2015-06-05 LAB — PAP IG AND CT-NG NAA: PAP Smear Comment: 0

## 2015-06-05 NOTE — Telephone Encounter (Signed)
-----   Message from Rubie Maid, MD sent at 06/05/2015  9:22 AM EST ----- Please inform of normal annual labs/STD results/pap and advise patient on Mychart

## 2015-06-05 NOTE — Telephone Encounter (Signed)
Called pt informed her of normal labs 

## 2015-06-16 ENCOUNTER — Other Ambulatory Visit: Payer: Self-pay | Admitting: Physician Assistant

## 2015-06-16 ENCOUNTER — Encounter: Payer: Self-pay | Admitting: Obstetrics and Gynecology

## 2015-06-16 DIAGNOSIS — F419 Anxiety disorder, unspecified: Secondary | ICD-10-CM

## 2015-06-16 DIAGNOSIS — F32A Depression, unspecified: Secondary | ICD-10-CM

## 2015-06-16 DIAGNOSIS — F329 Major depressive disorder, single episode, unspecified: Secondary | ICD-10-CM

## 2015-06-16 MED ORDER — ESCITALOPRAM OXALATE 10 MG PO TABS: 10.0000 mg | ORAL_TABLET | Freq: Every day | ORAL | Status: DC

## 2015-06-16 NOTE — Telephone Encounter (Signed)
Pt needs refill escitalopram (LEXAPRO) 10 MG tablet  She uses [facility] back [phone]  [employer]

## 2015-06-30 LAB — SPECIMEN STATUS REPORT

## 2015-06-30 LAB — HEPATITIS B SURFACE ANTIGEN: HEP B S AG: NEGATIVE

## 2015-06-30 LAB — HIV ANTIBODY (ROUTINE TESTING W REFLEX): HIV Screen 4th Generation wRfx: NONREACTIVE

## 2015-06-30 LAB — RPR QUALITATIVE: RPR Ser Ql: NONREACTIVE

## 2015-10-09 ENCOUNTER — Other Ambulatory Visit: Payer: Self-pay | Admitting: Physician Assistant

## 2015-10-09 DIAGNOSIS — F419 Anxiety disorder, unspecified: Secondary | ICD-10-CM

## 2015-10-09 MED ORDER — ALPRAZOLAM 0.5 MG PO TABS
0.5000 mg | ORAL_TABLET | Freq: Two times a day (BID) | ORAL | Status: DC | PRN
Start: 2015-10-09 — End: 2016-01-28

## 2015-10-09 NOTE — Telephone Encounter (Signed)
Pt contacted office for refill request on the following medications: ALPRAZolam (XANAX) 0.5 MG tablet to [facility]. Pt stated she has 4 pills left and would like to pick medication up from pharmacy by Monday 10/12/15. Last written on: 05/14/15. Last OV:05/14/15. Please advise. Thanks TNP

## 2015-10-09 NOTE — Telephone Encounter (Signed)
Patient advised that prescription for Xanax is up front for pick up.  Thanks,  -Joseline

## 2015-11-12 ENCOUNTER — Ambulatory Visit: Payer: No Typology Code available for payment source | Admitting: Physician Assistant

## 2016-01-28 ENCOUNTER — Encounter: Payer: Self-pay | Admitting: Physician Assistant

## 2016-01-28 ENCOUNTER — Ambulatory Visit (INDEPENDENT_AMBULATORY_CARE_PROVIDER_SITE_OTHER): Payer: Managed Care, Other (non HMO) | Admitting: Physician Assistant

## 2016-01-28 DIAGNOSIS — F329 Major depressive disorder, single episode, unspecified: Secondary | ICD-10-CM | POA: Diagnosis not present

## 2016-01-28 DIAGNOSIS — F419 Anxiety disorder, unspecified: Secondary | ICD-10-CM

## 2016-01-28 DIAGNOSIS — F32A Depression, unspecified: Secondary | ICD-10-CM

## 2016-01-28 MED ORDER — ESCITALOPRAM OXALATE 10 MG PO TABS: 10.0000 mg | ORAL_TABLET | Freq: Every day | ORAL | 6 refills | Status: DC

## 2016-01-28 MED ORDER — ALPRAZOLAM 0.5 MG PO TABS: ORAL_TABLET | ORAL | 1 refills | Status: DC

## 2016-01-28 NOTE — Patient Instructions (Signed)
Generalized Anxiety Disorder Generalized anxiety disorder (GAD) is a mental disorder. It interferes with life functions, including relationships, work, and school. GAD is different from normal anxiety, which everyone experiences at some point in their lives in response to specific life events and activities. Normal anxiety actually helps us prepare for and get through these life events and activities. Normal anxiety goes away after the event or activity is over.  GAD causes anxiety that is not necessarily related to specific events or activities. It also causes excess anxiety in proportion to specific events or activities. The anxiety associated with GAD is also difficult to control. GAD can vary from mild to severe. People with severe GAD can have intense waves of anxiety with physical symptoms (panic attacks).  SYMPTOMS The anxiety and worry associated with GAD are difficult to control. This anxiety and worry are related to many life events and activities and also occur more days than not for 6 months or longer. People with GAD also have three or more of the following symptoms (one or more in children):  Restlessness.   Fatigue.  Difficulty concentrating.   Irritability.  Muscle tension.  Difficulty sleeping or unsatisfying sleep. DIAGNOSIS GAD is diagnosed through an assessment by your health care provider. Your health care provider will ask you questions aboutyour mood,physical symptoms, and events in your life. Your health care provider may ask you about your medical history and use of alcohol or drugs, including prescription medicines. Your health care provider may also do a physical exam and blood tests. Certain medical conditions and the use of certain substances can cause symptoms similar to those associated with GAD. Your health care provider may refer you to a mental health specialist for further evaluation. TREATMENT The following therapies are usually used to treat GAD:    Medication. Antidepressant medication usually is prescribed for long-term daily control. Antianxiety medicines may be added in severe cases, especially when panic attacks occur.   Talk therapy (psychotherapy). Certain types of talk therapy can be helpful in treating GAD by providing support, education, and guidance. A form of talk therapy called cognitive behavioral therapy can teach you healthy ways to think about and react to daily life events and activities.  Stress managementtechniques. These include yoga, meditation, and exercise and can be very helpful when they are practiced regularly. A mental health specialist can help determine which treatment is best for you. Some people see improvement with one therapy. However, other people require a combination of therapies.   This information is not intended to replace advice given to you by your health care provider. Make sure you discuss any questions you have with your health care provider.   Document Released: 10/15/2012 Document Revised: 07/11/2014 Document Reviewed: 10/15/2012 Elsevier Interactive Patient Education 2016 Elsevier Inc.  

## 2016-01-28 NOTE — Progress Notes (Signed)
Patient: Lisa Ashley Female    DOB: 1966/02/23   50 y.o.   MRN: [id number] Visit Date: 01/28/2016  Today's Provider: Mar Daring, PA-C   Chief Complaint  Patient presents with  . Medication Refill   Subjective:    HPI Patient is here for Medication Refill on the Alprazolam and Escitalopram. She reports that her Depression and Anxiety is stable. She denies chest pain, chest tightness, palpitations or agitation. She reports that she is having trouble sleeping. She states her thoughts constantly race when she is trying to sleep. She does worry about her daughter and son-in-law often. Her daughter is living in a resident hall in [place], [place]. And her son-in-law works for the Facilities [employer]. 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 She has since got another 2nd job and is now working for [employer] as a Freight forwarder. She reports this has helped her a lot.      Allergies  Allergen Reactions  . Codeine Nausea And Vomiting   Current Meds  Medication Sig  . ALPRAZolam (XANAX) 0.5 MG tablet Take 1 tablet (0.5 mg total) by mouth 2 (two) times daily as needed for anxiety.  . Cholecalciferol (VITAMIN D3) 2000 UNITS TABS Take 1 tablet by mouth daily.  Marland Kitchen escitalopram (LEXAPRO) 10 MG tablet Take 1 tablet (10 mg total) by mouth daily.  . ferrous sulfate (IRON SUPPLEMENT) 325 (65 FE) MG tablet Take 1 tablet by mouth daily.  Marland Kitchen GREEN TEA, CAMILLIA SINENSIS, PO Take 1 tablet by mouth daily.  . meloxicam (MOBIC) 15 MG tablet Take 1 tablet (15 mg total) by mouth daily.  . MULTIPLE VITAMIN PO Take 1 tablet by mouth daily.  . Omega-3 Fatty Acids (FISH OIL CONCENTRATE) 435 MG CAPS Take 1 capsule by mouth daily.  Marland Kitchen OVER THE COUNTER MEDICATION Take by mouth 1 day or 1 dose.  . vitamin E 400 UNIT capsule Take 200 Units by mouth daily.  . Zinc Sulfate (ZINC 15 PO) Take 1 tablet by mouth daily.    Review of  Systems  Constitutional: Negative.   Respiratory: Negative.   Cardiovascular: Negative.   Gastrointestinal: Negative.   Psychiatric/Behavioral: Positive for sleep disturbance. Negative for agitation, decreased concentration, dysphoric mood, self-injury and suicidal ideas. The patient is nervous/anxious.     Social History  Substance Use Topics  . Smoking status: Former Smoker    Packs/day: 1.00    Years: 5.00    Types: Cigarettes  . Smokeless tobacco: Never Used     Comment: QUIT IN 2011  . Alcohol use No   Objective:   BP 138/70 (BP Location: Left Arm, Patient Position: Sitting, Cuff Size: Normal)   Pulse 70   Temp 98.5 F (36.9 C) (Oral)   Resp 16   Wt 214 lb 6.4 oz (97.3 kg)   BMI 29.90 kg/m   Physical Exam  Constitutional: She appears well-developed and well-nourished. No distress.  Cardiovascular: Normal rate, regular rhythm and normal heart sounds.  Exam reveals no gallop and no friction rub.   No murmur heard. Pulmonary/Chest: Effort normal and breath sounds normal. No respiratory distress. She has no wheezes. She has no rales.  Skin: She is not diaphoretic.  Psychiatric: She has a normal mood and affect. Her behavior is normal. Judgment and thought content normal.  Vitals reviewed.     Assessment & Plan:  1. Anxiety Stable. Diagnosis pulled for medication refill. Continue current medical treatment plan. I will see her back in 4 months for her CPE. - ALPRAZolam (XANAX) 0.5 MG tablet; Take 1 tab PO during the day prn for anxiety; 2 tabs PO q h.s. Prn for sleep  Dispense: 90 tablet; Refill: 1 - escitalopram (LEXAPRO) 10 MG tablet; Take 1 tablet (10 mg total) by mouth daily.  Dispense: 30 tablet; Refill: 6  2. Depression Stable. Diagnosis pulled for medication refill. Continue current medical treatment plan. - escitalopram (LEXAPRO) 10 MG tablet; Take 1 tablet (10 mg total) by mouth daily.  Dispense: 30 tablet; Refill: [place], PA-C    [facility]

## 2016-05-30 ENCOUNTER — Ambulatory Visit (INDEPENDENT_AMBULATORY_CARE_PROVIDER_SITE_OTHER): Payer: Managed Care, Other (non HMO) | Admitting: Physician Assistant

## 2016-05-30 ENCOUNTER — Encounter: Payer: Self-pay | Admitting: Physician Assistant

## 2016-05-30 VITALS — BP 130/70 | HR 72 | Temp 98.0°F | Resp 16 | Ht 71.0 in | Wt 225.8 lb

## 2016-05-30 DIAGNOSIS — R7303 Prediabetes: Secondary | ICD-10-CM | POA: Diagnosis not present

## 2016-05-30 DIAGNOSIS — I10 Essential (primary) hypertension: Secondary | ICD-10-CM | POA: Diagnosis not present

## 2016-05-30 DIAGNOSIS — Z136 Encounter for screening for cardiovascular disorders: Secondary | ICD-10-CM | POA: Diagnosis not present

## 2016-05-30 DIAGNOSIS — Z0000 Encounter for general adult medical examination without abnormal findings: Secondary | ICD-10-CM

## 2016-05-30 DIAGNOSIS — M7750 Other enthesopathy of unspecified foot: Secondary | ICD-10-CM | POA: Diagnosis not present

## 2016-05-30 DIAGNOSIS — Z13220 Encounter for screening for lipoid disorders: Secondary | ICD-10-CM

## 2016-05-30 DIAGNOSIS — E559 Vitamin D deficiency, unspecified: Secondary | ICD-10-CM | POA: Diagnosis not present

## 2016-05-30 DIAGNOSIS — Z8249 Family history of ischemic heart disease and other diseases of the circulatory system: Secondary | ICD-10-CM | POA: Diagnosis not present

## 2016-05-30 MED ORDER — MELOXICAM 15 MG PO TABS: 15.0000 mg | ORAL_TABLET | Freq: Every day | ORAL | 1 refills | Status: DC

## 2016-05-30 NOTE — Progress Notes (Signed)
Patient: Lisa Ashley, Female    DOB: 10-02-1965, 50 y.o.   MRN: [id number] Visit Date: 05/30/2016  Today's Provider: Mar Daring, PA-C   Chief Complaint  Patient presents with  . Annual Exam   Subjective:    Annual physical exam Lisa Ashley is a 50 y.o. female who presents today for health maintenance and complete physical. She feels well. She reports exercising. She reports she is sleeping well.  06/02/15 Pap-Negative-GYN-Dr. Marcelline Mates; Had ASCUS with colposcopy in 2014. 06/16/15 Mammogram-BI-RADS 1 -----------------------------------------------------------------   Review of Systems  Constitutional: Negative.   HENT: Negative.   Eyes: Negative.   Respiratory: Negative.   Cardiovascular: Negative.   Gastrointestinal: Negative.   Endocrine: Negative.   Genitourinary: Negative.   Musculoskeletal: Negative.   Skin: Negative.   Allergic/Immunologic: Negative.   Neurological: Negative.   Hematological: Negative.   Psychiatric/Behavioral: Negative.     Social History      She  reports that she has quit smoking. Her smoking use included Cigarettes. She has a 5.00 pack-year smoking history. She has never used smokeless tobacco. She reports that she does not drink alcohol or use drugs.       Social History   Social History  . Marital status: Single    Spouse name: N/A  . Number of children: N/A  . Years of education: N/A   Social History Main Topics  . Smoking status: Former Smoker    Packs/day: 1.00    Years: 5.00    Types: Cigarettes  . Smokeless tobacco: Never Used     Comment: QUIT IN 2011  . Alcohol use No  . Drug use: No  . Sexual activity: Yes    Birth control/ protection: Surgical   Other Topics Concern  . None   Social History Narrative  . None    History reviewed. No pertinent past medical history.   Patient Active Problem List   Diagnosis Date Noted  . Alopecia 12/15/2014  . Obesity 12/15/2014  . 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 Hypertension 12/15/2014  . Uterine fibroid 12/15/2014  . 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 Genital herpes 12/15/2014  . Anxiety 12/15/2014  . Depression 12/15/2014  . Vitamin D deficiency 12/15/2014  . High risk sexual behavior 12/15/2014  . Pre-diabetes 12/15/2014    Past Surgical History:  Procedure Laterality Date  . CESAREAN SECTION  1991  . LAPAROSCOPIC SUPRACERVICAL HYSTERECTOMY  2012  . RIGHT OOPHORECTOMY  2012    Family History        Family Status  Relation Status  . Mother Alive  . Father Deceased  . Brother Deceased  . Maternal Grandmother Deceased  . Maternal Grandfather Deceased  . Paternal Grandmother Deceased  . Paternal Grandfather Deceased        Her family history includes Breast cancer in her mother; Cancer in her maternal grandmother; Heart attack in her father; Stroke in her brother.     Allergies  Allergen Reactions  . Codeine Nausea And Vomiting     Current Outpatient Prescriptions:  .  ALPRAZolam (XANAX) 0.5 MG tablet, Take 1 tab PO during the day prn for anxiety; 2 tabs PO q h.s. Prn for sleep, Disp: 90 tablet, Rfl: 1 .  Cholecalciferol (VITAMIN D3) 2000 UNITS TABS, Take 1 tablet by mouth daily., Disp: , Rfl:  .  escitalopram (LEXAPRO) 10 MG tablet, Take 1 tablet (10 mg total) by mouth daily., Disp: 30 tablet, Rfl: 6 .  ferrous sulfate (IRON SUPPLEMENT) 325 (65 FE) MG tablet, Take 1 tablet by mouth daily., Disp: , Rfl:  .  GREEN TEA, CAMILLIA SINENSIS, PO, Take 1 tablet by mouth daily., Disp: , Rfl:  .  meloxicam (MOBIC) 15 MG tablet, Take 1 tablet (15 mg total) by mouth daily., Disp: 30 tablet, Rfl: 1 .  MULTIPLE VITAMIN PO, Take 1 tablet by mouth daily., Disp: , Rfl:  .  Omega-3 Fatty Acids (FISH OIL CONCENTRATE) 435 MG CAPS, Take 1 capsule by mouth daily., Disp: , Rfl:  .  OVER THE COUNTER MEDICATION, Take by mouth 1 day or 1 dose., Disp: , Rfl:  .  vitamin E 400 UNIT capsule, Take 200 Units by mouth daily., Disp: ,  Rfl:  .  Zinc Sulfate (ZINC 15 PO), Take 1 tablet by mouth daily., Disp: , Rfl:    Patient Care Team: Mar Daring, PA-C as PCP - General (Physician Assistant)      Objective:   Vitals: BP 130/70 (BP Location: Right Arm, Patient Position: Sitting, Cuff Size: Normal)   Pulse 72   Temp 98 F (36.7 C) (Oral)   Resp 16   Ht 5\' 11"  (1.803 m)   Wt 225 lb 12.8 oz (102.4 kg)   BMI 31.49 kg/m    Physical Exam  Constitutional: She is oriented to person, place, and time. She appears well-developed and well-nourished. No distress.  HENT:  Head: Normocephalic and atraumatic.  Right Ear: Hearing, tympanic membrane, external ear and ear canal normal.  Left Ear: Hearing, tympanic membrane, external ear and ear canal normal.  Nose: Nose normal.  Mouth/Throat: Uvula is midline, oropharynx is clear and moist and mucous membranes are normal. No oropharyngeal exudate.  Eyes: Conjunctivae and EOM are normal. Pupils are equal, round, and reactive to light. Right eye exhibits no discharge. Left eye exhibits no discharge. No scleral icterus.  Neck: Normal range of motion. Neck supple. No JVD present. No tracheal deviation present. No thyromegaly present.  Cardiovascular: Normal rate, regular rhythm, normal heart sounds and intact distal pulses.  Exam reveals no gallop and no friction rub.   No murmur heard. Pulmonary/Chest: Effort normal and breath sounds normal. No respiratory distress. She has no wheezes. She has no rales. She exhibits no tenderness.  Abdominal: Soft. Bowel sounds are normal. She exhibits no distension and no mass. There is no tenderness. There is no rebound and no guarding.  Genitourinary:  Genitourinary Comments: Done by Dr. Marcelline Mates this year.  Musculoskeletal: Normal range of motion. She exhibits no edema or tenderness.  Lymphadenopathy:    She has no cervical adenopathy.  Neurological: She is alert and oriented to person, place, and time.  Skin: Skin is warm and dry. No  rash noted. She is not diaphoretic.  Psychiatric: She has a normal mood and affect. Her behavior is normal. Judgment and thought content normal.  Vitals reviewed.    Depression Screen No flowsheet data found.    Assessment & Plan:     Routine Health Maintenance and Physical Exam  Exercise Activities and Dietary recommendations Goals    None      Immunization History  Administered Date(s) Administered  . Tdap 01/19/2012    Health Maintenance  Topic Date Due  . INFLUENZA VACCINE  02/02/2016  . PAP SMEAR  06/01/2018  . TETANUS/TDAP  01/18/2022  . HIV Screening  Completed     Discussed health benefits of physical activity, and encouraged her to engage in regular exercise appropriate for her age  and condition.    1. Annual physical exam Normal physical exam today. Will check labs as below and f/u pending lab results. If labs are stable and WNL she will not need to have these rechecked for one year at her next annual physical exam. She is to call the office in the meantime if she has any acute issue, questions or concerns. - TSH  2. Essential hypertension Will check labs as below and f/u pending results. - CBC with Differential/Platelet - Comprehensive metabolic panel - TSH  3. Vitamin D deficiency History of this. Currently on vitamin D supplementation 2000 international units daily. Will check labs as below and f/u pending results. - Vitamin D (25 hydroxy)  4. Pre-diabetes Will check labs as below and f/u pending results. - Comprehensive metabolic panel - HgB 123456  5. Encounter for lipid screening for cardiovascular disease Will check labs as below and f/u pending results. - Lipid panel  6. Family history of cardiovascular disease Family history in father and brother. - Lipid panel  7. Tendinitis of ankle Stable. Diagnosis pulled for medication refill. Continue current medical treatment plan. - meloxicam (MOBIC) 15 MG tablet; Take 1 tablet (15 mg total)  by mouth daily.  Dispense: 30 tablet; Refill: 1  --------------------------------------------------------------------    Mar Daring, PA-C  [facility]

## 2016-05-30 NOTE — Patient Instructions (Signed)

## 2016-06-01 LAB — CBC WITH DIFFERENTIAL/PLATELET
BASOS ABS: 0 10*3/uL (ref 0.0–0.2)
BASOS: 0 %
EOS (ABSOLUTE): 0.2 10*3/uL (ref 0.0–0.4)
Eos: 3 %
Hematocrit: 34.2 % (ref 34.0–46.6)
Hemoglobin: 11.6 g/dL (ref 11.1–15.9)
Immature Grans (Abs): 0 10*3/uL (ref 0.0–0.1)
Immature Granulocytes: 0 %
LYMPHS ABS: 1.9 10*3/uL (ref 0.7–3.1)
LYMPHS: 30 %
MCH: 30.3 pg (ref 26.6–33.0)
MCHC: 33.9 g/dL (ref 31.5–35.7)
MCV: 89 fL (ref 79–97)
MONOS ABS: 0.6 10*3/uL (ref 0.1–0.9)
Monocytes: 9 %
NEUTROS ABS: 3.6 10*3/uL (ref 1.4–7.0)
Neutrophils: 58 %
PLATELETS: 310 10*3/uL (ref 150–379)
RBC: 3.83 x10E6/uL (ref 3.77–5.28)
RDW: 13.4 % (ref 12.3–15.4)
WBC: 6.3 10*3/uL (ref 3.4–10.8)

## 2016-06-01 LAB — COMPREHENSIVE METABOLIC PANEL
A/G RATIO: 1.6 (ref 1.2–2.2)
ALK PHOS: 77 IU/L (ref 39–117)
ALT: 25 IU/L (ref 0–32)
AST: 24 IU/L (ref 0–40)
Albumin: 4.4 g/dL (ref 3.5–5.5)
BILIRUBIN TOTAL: 0.3 mg/dL (ref 0.0–1.2)
BUN/Creatinine Ratio: 20 (ref 9–23)
BUN: 13 mg/dL (ref 6–24)
CHLORIDE: 99 mmol/L (ref 96–106)
CO2: 23 mmol/L (ref 18–29)
Calcium: 9.4 mg/dL (ref 8.7–10.2)
Creatinine, Ser: 0.65 mg/dL (ref 0.57–1.00)
GFR calc non Af Amer: 105 mL/min/{1.73_m2} (ref >59)
GFR, EST AFRICAN AMERICAN: 121 mL/min/{1.73_m2} (ref >59)
GLUCOSE: 116 mg/dL — AB (ref 65–99)
Globulin, Total: 2.7 g/dL (ref 1.5–4.5)
POTASSIUM: 4 mmol/L (ref 3.5–5.2)
Sodium: 140 mmol/L (ref 134–144)
TOTAL PROTEIN: 7.1 g/dL (ref 6.0–8.5)

## 2016-06-01 LAB — LIPID PANEL
CHOLESTEROL TOTAL: 193 mg/dL (ref 100–199)
Chol/HDL Ratio: 3.9 ratio units (ref 0.0–4.4)
HDL: 49 mg/dL (ref >39)
LDL Calculated: 123 mg/dL — ABNORMAL HIGH (ref 0–99)
Triglycerides: 106 mg/dL (ref 0–149)
VLDL Cholesterol Cal: 21 mg/dL (ref 5–40)

## 2016-06-01 LAB — HEMOGLOBIN A1C
ESTIMATED AVERAGE GLUCOSE: 114 mg/dL
HEMOGLOBIN A1C: 5.6 % (ref 4.8–5.6)

## 2016-06-01 LAB — TSH: TSH: 5.18 u[IU]/mL — AB (ref 0.450–4.500)

## 2016-06-01 LAB — VITAMIN D 25 HYDROXY (VIT D DEFICIENCY, FRACTURES): Vit D, 25-Hydroxy: 35.7 ng/mL (ref 30.0–100.0)

## 2016-06-02 ENCOUNTER — Encounter: Payer: Self-pay | Admitting: Physician Assistant

## 2016-06-02 ENCOUNTER — Ambulatory Visit (INDEPENDENT_AMBULATORY_CARE_PROVIDER_SITE_OTHER): Payer: Managed Care, Other (non HMO) | Admitting: Obstetrics and Gynecology

## 2016-06-02 ENCOUNTER — Encounter: Payer: Self-pay | Admitting: Obstetrics and Gynecology

## 2016-06-02 VITALS — BP 166/74 | HR 76 | Ht 71.0 in | Wt 221.6 lb

## 2016-06-02 DIAGNOSIS — Z1231 Encounter for screening mammogram for malignant neoplasm of breast: Secondary | ICD-10-CM | POA: Diagnosis not present

## 2016-06-02 DIAGNOSIS — Z124 Encounter for screening for malignant neoplasm of cervix: Secondary | ICD-10-CM

## 2016-06-02 DIAGNOSIS — Z8742 Personal history of other diseases of the female genital tract: Secondary | ICD-10-CM

## 2016-06-02 DIAGNOSIS — Z1239 Encounter for other screening for malignant neoplasm of breast: Secondary | ICD-10-CM

## 2016-06-02 DIAGNOSIS — Z01419 Encounter for gynecological examination (general) (routine) without abnormal findings: Secondary | ICD-10-CM

## 2016-06-02 DIAGNOSIS — Z87898 Personal history of other specified conditions: Secondary | ICD-10-CM | POA: Diagnosis not present

## 2016-06-02 DIAGNOSIS — R7989 Other specified abnormal findings of blood chemistry: Secondary | ICD-10-CM

## 2016-06-02 DIAGNOSIS — R946 Abnormal results of thyroid function studies: Secondary | ICD-10-CM | POA: Diagnosis not present

## 2016-06-02 NOTE — Patient Instructions (Signed)

## 2016-06-02 NOTE — Progress Notes (Signed)
GYNECOLOGY ANNUAL EXAM CLINIC PROGRESS NOTE  Subjective:    Lisa Ashley is a 50 y.o. G39P1001 female who presents for an annual exam. The patient has no complaints today. The patient is sexually active. The patient wears seatbelts: yes. The patient participates in regular exercise: no. Has the patient ever been transfused or tattooed?: yes (multiple tattoos). The patient reports that there is not domestic violence in her life.   Menstrual History: Menarche age: 62 No LMP recorded. Patient has had a hysterectomy.  H/o abnormal pap smear in 2014 (ASCUS HR HPV) followed by colposcopy.  Subusequent pap in 2016 normal.  Denies h/o STIs.  last pap: approximate date 05/2014 and was normal and last mammogram: approximate date 03/2014 and was normal.Denies h/o abnormal mammograms.     OB History  Gravida Para Term Preterm AB Living  1 1 1     1   SAB TAB Ectopic Multiple Live Births               # Outcome Date GA Lbr Len/2nd Weight Sex Delivery Anes PTL Lv  1 Term 1991   8 lb 6 oz (3.799 kg)  CS-LTranv          History reviewed. No pertinent past medical history.  Family History  Problem Relation Age of Onset  . Breast cancer Mother   . Heart disease Mother   . Kidney disease Mother   . Heart attack Father   . Stroke Brother   . Cancer Maternal Grandmother     Past Surgical History:  Procedure Laterality Date  . CESAREAN SECTION  1991  . LAPAROSCOPIC SUPRACERVICAL HYSTERECTOMY  2012  . RIGHT OOPHORECTOMY  2012    Social History   Social History  . Marital status: Single    Spouse name: N/A  . Number of children: N/A  . Years of education: N/A   Occupational History  . Not on file.   Social History Main Topics  . Smoking status: Former Smoker    Packs/day: 1.00    Years: 5.00    Types: Cigarettes  . Smokeless tobacco: Never Used     Comment: QUIT IN 2011  . Alcohol use No  . Drug use: No  . Sexual activity: Yes    Birth control/ protection: Surgical    Other Topics Concern  . Not on file   Social History Narrative  . No narrative on file    Current Outpatient Prescriptions on File Prior to Visit  Medication Sig Dispense Refill  . ALPRAZolam (XANAX) 0.5 MG tablet Take 1 tab PO during the day prn for anxiety; 2 tabs PO q h.s. Prn for sleep 90 tablet 1  . Cholecalciferol (VITAMIN D3) 2000 UNITS TABS Take 1 tablet by mouth daily.    Marland Kitchen escitalopram (LEXAPRO) 10 MG tablet Take 1 tablet (10 mg total) by mouth daily. 30 tablet 6  . ferrous sulfate (IRON SUPPLEMENT) 325 (65 FE) MG tablet Take 1 tablet by mouth daily.    Marland Kitchen GREEN TEA, CAMILLIA SINENSIS, PO Take 1 tablet by mouth daily.    . meloxicam (MOBIC) 15 MG tablet Take 1 tablet (15 mg total) by mouth daily. 30 tablet 1  . MULTIPLE VITAMIN PO Take 1 tablet by mouth daily.    . Omega-3 Fatty Acids (FISH OIL CONCENTRATE) 435 MG CAPS Take 1 capsule by mouth daily.    Marland Kitchen OVER THE COUNTER MEDICATION Take by mouth 1 day or 1 dose.    Marland Kitchen  vitamin E 400 UNIT capsule Take 200 Units by mouth daily.    . Zinc Sulfate (ZINC 15 PO) Take 1 tablet by mouth daily.     No current facility-administered medications on file prior to visit.      Allergies  Allergen Reactions  . Codeine Nausea And Vomiting     Review of Systems Constitutional: negative for chills, fatigue, fevers and sweats Eyes: negative for irritation, redness and visual disturbance Ears, nose, mouth, throat, and face: negative for hearing loss, nasal congestion, snoring and tinnitus Respiratory: negative for asthma, cough, sputum Cardiovascular: negative for chest pain, dyspnea, exertional chest pressure/discomfort, irregular heart beat, palpitations and syncope Gastrointestinal: negative for abdominal pain, change in bowel habits, nausea and vomiting Genitourinary: negative for abnormal menstrual periods, genital lesions, sexual problems and vaginal discharge, dysuria and urinary incontinence Integument/breast: negative for breast  lump, breast tenderness and nipple discharge Hematologic/lymphatic: negative for bleeding and easy bruising Musculoskeletal:negative for back pain and muscle weakness Neurological: negative for dizziness, headaches, vertigo and weakness Endocrine: negative for diabetic symptoms including polydipsia, polyuria and skin dryness Allergic/Immunologic: negative for hay fever and urticaria     Objective:    BP (!) 166/74 (BP Location: Left Arm, Patient Position: Sitting, Cuff Size: Large)   Pulse 76   Ht 5\' 11"  (1.803 m)   Wt 221 lb 9.6 oz (100.5 kg)   BMI 30.91 kg/m .     General Appearance:    Alert, cooperative, no distress, appears stated age  Head:    Normocephalic, without obvious abnormality, atraumatic  Eyes:    PERRL, conjunctiva/corneas clear, EOM's intact, both eyes  Ears:    Normal external ear canals, both ears  Nose:   Nares normal, septum midline, mucosa normal, no drainage or sinus tenderness  Throat:   Lips, mucosa, and tongue normal; teeth and gums normal  Neck:   Supple, symmetrical, trachea midline, no adenopathy; thyroid: no enlargement/tenderness/nodules; no carotid bruit or JVD  Back:     Symmetric, no curvature, ROM normal, no CVA tenderness  Lungs:     Clear to auscultation bilaterally, respirations unlabored  Chest Wall:    No tenderness or deformity   Heart:    Regular rate and rhythm, S1 and S2 normal, no murmur, rub or gallop  Breast Exam:    No tenderness, masses, or nipple abnormality  Abdomen:     Soft, non-tender, bowel sounds active all four quadrants, no masses, no organomegaly.  Genitalia:    Pelvic:external genitalia normal, vagina without lesions, discharge, or tenderness; rectovaginal septum  normal. Cervix normal in appearance, os stenotic, no cervical motion tenderness.  Uterus surgically absent. Right  ovary surgically absent.  Bilateral adnexa without masses or tenderness.  Rectal:    Normal external sphincter.  No hemorrhoids appreciated. Internal  exam not done.   Extremities:   Extremities normal, atraumatic, no cyanosis or edema  Pulses:   2+ and symmetric all extremities  Skin:   Skin color, texture, turgor normal, no rashes or lesions  Lymph nodes:   Cervical, supraclavicular, and axillary nodes normal  Neurologic:   CNII-XII intact, normal strength, sensation and reflexes throughout      Labs:  Lab Results  Component Value Date   WBC 6.3 05/31/2016   HCT 34.2 05/31/2016   MCV 89 05/31/2016   PLT 310 05/31/2016      Chemistry      Component Value Date/Time   NA 140 05/31/2016 0829   K 4.0 05/31/2016 0829   CL  99 05/31/2016 0829   CO2 23 05/31/2016 0829   BUN 13 05/31/2016 0829   CREATININE 0.65 05/31/2016 0829      Component Value Date/Time   CALCIUM 9.4 05/31/2016 0829   ALKPHOS 77 05/31/2016 0829   AST 24 05/31/2016 0829   ALT 25 05/31/2016 0829   BILITOT 0.3 05/31/2016 0829     Lab Results  Component Value Date   CHOL 193 05/31/2016   HDL 49 05/31/2016   LDLCALC 123 (H) 05/31/2016   TRIG 106 05/31/2016   CHOLHDL 3.9 05/31/2016   This SmartLink has not been configured with any valid records.     Lab Results  Component Value Date   TSH 5.180 (H) 05/31/2016   Lab Results  Component Value Date   HGBA1C 5.6 05/31/2016     Assessment:    Healthy female exam.   Overweight H/o abnormal pap smear Elevated TSH  Plan:     Blood tests: previously had labs done with PCP. Breast self exam technique reviewed and patient encouraged to perform self-exam monthly. Contraception: status post hysterectomy. Discussed healthy lifestyle modifications. Mammogram ordered. Pap smear performed. After this pap smear as long as normal, patient, can resume routine screening (q 3-5 years) Discussed elevated TSH, patient has appt to repeat labs in 6 weeks with PCP.  Flu vaccine declined today.    Rubie Maid, MD [facility]

## 2016-06-07 LAB — PAP IG AND HPV HIGH-RISK
HPV, high-risk: POSITIVE — AB
PAP SMEAR COMMENT: 0

## 2016-06-13 ENCOUNTER — Telehealth: Payer: Self-pay

## 2016-06-13 ENCOUNTER — Encounter: Payer: Self-pay | Admitting: Obstetrics and Gynecology

## 2016-06-13 NOTE — Telephone Encounter (Signed)
Called pt no answer. LM for pt informing her of information below.  

## 2016-06-13 NOTE — Telephone Encounter (Signed)
-----   Message from Rubie Maid, MD sent at 06/08/2016  5:41 PM EST ----- Please see if they can add HPV typing (16/18 to current pap).

## 2016-06-20 ENCOUNTER — Encounter: Payer: Self-pay | Admitting: Physician Assistant

## 2016-07-28 ENCOUNTER — Other Ambulatory Visit: Payer: Self-pay | Admitting: Family Medicine

## 2016-07-28 ENCOUNTER — Other Ambulatory Visit: Payer: Self-pay | Admitting: Physician Assistant

## 2016-07-28 DIAGNOSIS — F419 Anxiety disorder, unspecified: Secondary | ICD-10-CM

## 2016-07-28 MED ORDER — ALPRAZOLAM 0.5 MG PO TABS: ORAL_TABLET | ORAL | 0 refills | Status: DC

## 2016-07-28 NOTE — Telephone Encounter (Signed)
This a pt of Jenni.  Tawanna Sat is out of the office until Thursday.  Pt contacted office for refill request on the following medications:  ALPRAZolam (XANAX) 0.5 MG tablet.  [employer]  [id number]  Pt only has 2 pills left/MW

## 2016-07-28 NOTE — Telephone Encounter (Signed)
prescription has been called not pharmacy, left message for patient notifying her of this. KW

## 2016-07-28 NOTE — Telephone Encounter (Signed)
Please review. KW 

## 2016-07-28 NOTE — Telephone Encounter (Signed)
Please call in Xanax refill as updated in the EMR.

## 2016-08-04 ENCOUNTER — Telehealth: Payer: Self-pay | Admitting: Physician Assistant

## 2016-08-04 DIAGNOSIS — E039 Hypothyroidism, unspecified: Secondary | ICD-10-CM

## 2016-08-04 NOTE — Telephone Encounter (Signed)
Patient advised.  Thanks,  -Markus Casten 

## 2016-08-04 NOTE — Telephone Encounter (Signed)
Pt is requesting a lab slip to have her TSH checked. Please advise. Thanks TNP

## 2016-08-04 NOTE — Telephone Encounter (Signed)
Is it only the TSH that she needs?

## 2016-08-04 NOTE — Telephone Encounter (Signed)
Pulled and signed thyroid panel with TSH. She can get lab at her convenience now.

## 2016-08-06 LAB — THYROID PANEL WITH TSH
FREE THYROXINE INDEX: 2.1 (ref 1.2–4.9)
T3 UPTAKE RATIO: 26 % (ref 24–39)
T4 TOTAL: 8.1 ug/dL (ref 4.5–12.0)
TSH: 3.17 u[IU]/mL (ref 0.450–4.500)

## 2016-08-08 ENCOUNTER — Telehealth: Payer: Self-pay

## 2016-08-08 NOTE — Telephone Encounter (Signed)
Advised pt of lab results. Pt verbally acknowledges understanding. Emily Drozdowski, CMA   

## 2016-08-08 NOTE — Telephone Encounter (Signed)
-----   Message from Mar Daring, PA-C sent at 08/07/2016  9:42 AM EST ----- Thyroid panel normal.

## 2016-08-09 ENCOUNTER — Ambulatory Visit: Payer: Self-pay | Admitting: Physician Assistant

## 2016-08-09 VITALS — BP 120/70 | HR 75 | Temp 98.2°F

## 2016-08-09 DIAGNOSIS — J0100 Acute maxillary sinusitis, unspecified: Secondary | ICD-10-CM

## 2016-08-09 MED ORDER — PREDNISONE 10 MG PO TABS: 30.0000 mg | ORAL_TABLET | Freq: Every day | ORAL | 0 refills | Status: DC

## 2016-08-09 MED ORDER — AMOXICILLIN 875 MG PO TABS: 875.0000 mg | ORAL_TABLET | Freq: Two times a day (BID) | ORAL | 0 refills | Status: DC

## 2016-08-09 NOTE — Progress Notes (Signed)
S: C/o runny nose and congestion for 3 weeks, no fever, chills, cp/sob, v/d; mucus is green and thick, cough is sporadic, c/o of facial and dental pain. Both ears are hurting  Using otc meds:   O: PE: vitals wnl, nad, perrl eomi, normocephalic, tms dull, nasal mucosa red and swollen, throat injected, neck supple no lymph, lungs c t a, cv rrr, neuro intact  A:  Acute sinusitis, ear pain   P: drink fluids, continue regular meds , use otc meds of choice, return if not improving in 5 days, return earlier if worsening , amoxil , pred 30mg  qd x 3d

## 2016-09-19 ENCOUNTER — Ambulatory Visit (INDEPENDENT_AMBULATORY_CARE_PROVIDER_SITE_OTHER): Payer: Managed Care, Other (non HMO) | Admitting: Physician Assistant

## 2016-09-19 ENCOUNTER — Encounter: Payer: Self-pay | Admitting: Physician Assistant

## 2016-09-19 DIAGNOSIS — F331 Major depressive disorder, recurrent, moderate: Secondary | ICD-10-CM

## 2016-09-19 DIAGNOSIS — F419 Anxiety disorder, unspecified: Secondary | ICD-10-CM | POA: Diagnosis not present

## 2016-09-19 MED ORDER — ALPRAZOLAM 0.5 MG PO TABS: ORAL_TABLET | ORAL | 5 refills | Status: DC

## 2016-09-19 MED ORDER — ESCITALOPRAM OXALATE 10 MG PO TABS: 10.0000 mg | ORAL_TABLET | Freq: Every day | ORAL | 6 refills | Status: DC

## 2016-09-19 NOTE — Patient Instructions (Signed)

## 2016-09-19 NOTE — Progress Notes (Signed)
Patient: Lisa Ashley Female    DOB: 1965/11/21   51 y.o.   MRN: [id number] Visit Date: 09/19/2016  Today's Provider: Mar Daring, PA-C   Chief Complaint  Patient presents with  . Depression   Subjective:    HPI  Depression, Follow-up  She  was last seen for this 8 months ago. Changes made at last visit include no changes.   She reports excellent compliance with treatment. She is not having side effects.   She reports excellent tolerance of treatment. Current symptoms include: depressed mood, difficulty concentrating, fatigue, hopelessness, hypersomnia, insomnia and weight gain She feels she is Worse since last visit. It is worse since last visit due to the death of her mother on Sep 22, 2016. Her mother was 64 years old and had fallen and broken a hip 2 weeks prior to her death. Her mother lived in [place] and her immediate family there did not call her until after her mother had passed. A friend she had that worked at the hospital called her the day before her mother passed and told her that her mom was there and not doing well. This has caused her a little more grief not having been there to have saud goodbye to her mom.  ------------------------------------------------------------------------   Follow up for anxiety  The patient was last seen for this 8 months ago. Changes made at last visit include no changes.  She reports excellent compliance with treatment. She feels that condition is Worse. She is not having side effects.   ------------------------------------------------------------------------------------   Hypertension, follow-up:  BP Readings from Last 3 Encounters:  09/19/16 132/76  08/09/16 120/70  06/02/16 (!) 166/74    She was last seen for hypertension 4 months ago.  BP at that visit was 130/70. Management changes since that visit include no changes. She is exercising. She is not adherent to low salt diet.   Outside blood pressures  are not being checked. She is experiencing none.  Patient denies chest pain.   Cardiovascular risk factors include hypertension, obesity (BMI >= 30 kg/m2) and smoking/ tobacco exposure.  Use of agents associated with hypertension: none.     Weight trend: increasing steadily Wt Readings from Last 3 Encounters:  09/19/16 232 lb 6.4 oz (105.4 kg)  06/02/16 221 lb 9.6 oz (100.5 kg)  05/30/16 225 lb 12.8 oz (102.4 kg)    Current diet: in general, an "unhealthy" diet  ------------------------------------------------------------------------      Allergies  Allergen Reactions  . Codeine Nausea And Vomiting     Current Outpatient Prescriptions:  .  ALPRAZolam (XANAX) 0.5 MG tablet, Take 1 tab PO during the day prn for anxiety; 2 tabs PO q h.s. Prn for sleep, Disp: 90 tablet, Rfl: 0 .  Cholecalciferol (VITAMIN D3) 2000 UNITS TABS, Take 1 tablet by mouth daily., Disp: , Rfl:  .  escitalopram (LEXAPRO) 10 MG tablet, Take 1 tablet (10 mg total) by mouth daily., Disp: 30 tablet, Rfl: 6 .  ferrous sulfate (IRON SUPPLEMENT) 325 (65 FE) MG tablet, Take 1 tablet by mouth daily., Disp: , Rfl:  .  GREEN TEA, CAMILLIA SINENSIS, PO, Take 1 tablet by mouth daily., Disp: , Rfl:  .  meloxicam (MOBIC) 15 MG tablet, Take 1 tablet (15 mg total) by mouth daily. (Patient taking differently: Take 15 mg by mouth as needed. ), Disp: 30 tablet, Rfl: 1 .  MULTIPLE VITAMIN PO, Take 1 tablet by mouth daily., Disp: , Rfl:  .  Omega-3 Fatty Acids (FISH OIL CONCENTRATE) 435 MG CAPS, Take 1 capsule by mouth daily., Disp: , Rfl:  .  OVER THE COUNTER MEDICATION, Take by mouth 1 day or 1 dose., Disp: , Rfl:  .  vitamin E 400 UNIT capsule, Take 200 Units by mouth daily., Disp: , Rfl:  .  Zinc Sulfate (ZINC 15 PO), Take 1 tablet by mouth daily., Disp: , Rfl:   Review of Systems  Constitutional: Positive for activity change, appetite change, fatigue and unexpected weight change.  Respiratory: Negative.   Cardiovascular:  Negative.   Gastrointestinal: Negative.   Neurological: Negative.   Psychiatric/Behavioral: Positive for agitation, confusion and decreased concentration. The patient is nervous/anxious.     Social History  Substance Use Topics  . Smoking status: Former Smoker    Packs/day: 1.00    Years: 5.00    Types: Cigarettes  . Smokeless tobacco: Never Used     Comment: QUIT IN 2011  . Alcohol use No   Objective:   BP 132/76 (BP Location: Left Arm, Patient Position: Sitting, Cuff Size: Large)   Pulse 72   Temp 98 F (36.7 C) (Oral)   Resp 16   Ht 5\' 11"  (1.803 m)   Wt 232 lb 6.4 oz (105.4 kg)   SpO2 96%   BMI 32.41 kg/m  Vitals:   09/19/16 0840  BP: 132/76  Pulse: 72  Resp: 16  Temp: 98 F (36.7 C)  TempSrc: Oral  SpO2: 96%  Weight: 232 lb 6.4 oz (105.4 kg)  Height: 5\' 11"  (1.803 m)   Depression screen [facility] 2/9 09/19/2016  Decreased Interest 2  Down, Depressed, Hopeless 2  PHQ - 2 Score 4  Altered sleeping 3  Tired, decreased energy 3  Change in appetite 3  Feeling bad or failure about yourself  2  Trouble concentrating 3  Moving slowly or fidgety/restless 2  Suicidal thoughts 0  PHQ-9 Score 20  Difficult doing work/chores Very difficult   GAD 7 : Generalized Anxiety Score 09/19/2016  Nervous, Anxious, on Edge 3  Control/stop worrying 3  Worry too much - different things 3  Trouble relaxing 3  Restless 3  Easily annoyed or irritable 3  Afraid - awful might happen 2  Total GAD 7 Score 20  Anxiety Difficulty Very difficult     Physical Exam  Constitutional: She appears well-developed and well-nourished. No distress.  Neck: Normal range of motion. Neck supple. No JVD present. No tracheal deviation present. No thyromegaly present.  Cardiovascular: Normal rate, regular rhythm and normal heart sounds.  Exam reveals no gallop and no friction rub.   No murmur heard. Pulmonary/Chest: Effort normal and breath sounds normal. No respiratory distress. She has no wheezes.  She has no rales.  Lymphadenopathy:    She has no cervical adenopathy.  Skin: She is not diaphoretic.  Psychiatric: Her speech is normal and behavior is normal. Judgment and thought content normal. Cognition and memory are normal. She exhibits a depressed mood.  Tearful when discussing the situation and her mom  Vitals reviewed.       Assessment & Plan:     1. Anxiety Currently not well controlled situationally due to the death of her mother. She reports she thinks she is doing well and things get better day by day. She does not wish to change her dose at this time. She will call if symptoms worsen in the meantime.  - escitalopram (LEXAPRO) 10 MG tablet; Take 1 tablet (10 mg total) by mouth  daily.  Dispense: 30 tablet; Refill: 6 - ALPRAZolam (XANAX) 0.5 MG tablet; Take 1 tab PO during the day prn for anxiety; 2 tabs PO q h.s. Prn for sleep  Dispense: 90 tablet; Refill: 5  2. Moderate episode of recurrent major depressive disorder ([place]) See above medical treatment plan. - escitalopram (LEXAPRO) 10 MG tablet; Take 1 tablet (10 mg total) by mouth daily.  Dispense: 30 tablet; Refill: [place], PA-C  [facility]

## 2016-10-06 ENCOUNTER — Encounter: Payer: Self-pay | Admitting: Physician Assistant

## 2016-10-06 ENCOUNTER — Ambulatory Visit: Payer: Self-pay | Admitting: Physician Assistant

## 2016-10-06 VITALS — BP 120/79 | HR 79 | Temp 98.2°F

## 2016-10-06 DIAGNOSIS — H6982 Other specified disorders of Eustachian tube, left ear: Secondary | ICD-10-CM

## 2016-10-06 DIAGNOSIS — J0100 Acute maxillary sinusitis, unspecified: Secondary | ICD-10-CM

## 2016-10-06 MED ORDER — PREDNISONE 10 MG PO TABS: 30.0000 mg | ORAL_TABLET | Freq: Every day | ORAL | 0 refills | Status: DC

## 2016-10-06 MED ORDER — AZITHROMYCIN 250 MG PO TABS: ORAL_TABLET | ORAL | 0 refills | Status: DC

## 2016-10-06 MED ORDER — FLUTICASONE PROPIONATE 50 MCG/ACT NA SUSP: 2.0000 | Freq: Every day | NASAL | 6 refills | Status: DC

## 2016-10-06 NOTE — Progress Notes (Signed)
S: C/o ear pain and  congestion for 3-5  days, no fever, chills, cp/sob, v/d; mostly she feels that the ear is stopped up, facial pain and headache for about a week, got a little dizzy yesterday and felt like her balance was off Using otc meds:   O: PE: vitals wnl, nad, perrl eomi, normocephalic, tms dull worse on left,  nasal mucosa red and swollen, throat injected, neck supple no lymph, lungs c t a, cv rrr, neuro intact  A:  Acute sinusitis   P: drink fluids, continue regular meds , use otc meds of choice, return if not improving in 5 days, return earlier if worsening , zpack, pred 30mg  qd x 3d, flonase

## 2017-01-09 ENCOUNTER — Ambulatory Visit (INDEPENDENT_AMBULATORY_CARE_PROVIDER_SITE_OTHER): Payer: Managed Care, Other (non HMO) | Admitting: Physician Assistant

## 2017-01-09 ENCOUNTER — Encounter: Payer: Self-pay | Admitting: Physician Assistant

## 2017-01-09 VITALS — BP 138/80 | HR 71 | Temp 98.3°F | Resp 16 | Wt 232.2 lb

## 2017-01-09 DIAGNOSIS — F4320 Adjustment disorder, unspecified: Secondary | ICD-10-CM | POA: Diagnosis not present

## 2017-01-09 DIAGNOSIS — F419 Anxiety disorder, unspecified: Secondary | ICD-10-CM

## 2017-01-09 DIAGNOSIS — F321 Major depressive disorder, single episode, moderate: Secondary | ICD-10-CM | POA: Diagnosis not present

## 2017-01-09 DIAGNOSIS — F4321 Adjustment disorder with depressed mood: Secondary | ICD-10-CM

## 2017-01-09 DIAGNOSIS — M7750 Other enthesopathy of unspecified foot: Secondary | ICD-10-CM | POA: Diagnosis not present

## 2017-01-09 MED ORDER — MELOXICAM 15 MG PO TABS: 15.0000 mg | ORAL_TABLET | Freq: Every day | ORAL | 5 refills | Status: DC

## 2017-01-09 MED ORDER — ESCITALOPRAM OXALATE 20 MG PO TABS: 20.0000 mg | ORAL_TABLET | Freq: Every day | ORAL | 5 refills | Status: DC

## 2017-01-09 MED ORDER — ALPRAZOLAM 0.5 MG PO TABS: ORAL_TABLET | ORAL | 5 refills | Status: DC

## 2017-01-09 NOTE — Patient Instructions (Signed)

## 2017-01-09 NOTE — Progress Notes (Signed)
Patient: Lisa Ashley Female    DOB: 06-Feb-1966   51 y.o.   MRN: [id number] Visit Date: 01/09/2017  Today's Provider: Mar Daring, PA-C   Chief Complaint  Patient presents with  . Medication Refill   Subjective:    HPI Patient comes in today for Medication Refill.   Depression, Follow up:  The patient was last seen for Depression 3 months ago. Changes made since that visit include none.  She reports excellent compliance with treatment. She is not having side effects. .  She complains of depressed mood, difficulty concentrating, fatigue, hopelessness and weight gain She denies current suicidal and homicidal plan or intent.   She reports feeling better than she did last time she saw [place]. She had a panic attack on Saturday, she feels more grief over her mother's death more recently but has been coping she feels. She took a Xanax during the day on Saturday to help, she usually takes Xanax to help her sleep. She is not exercising and is binging more. ------------------------------------------------------------------------   Anxiety, Follow up:  The patient was last seen for Anxiety 3 months ago. Changes made since that visit include none.  She reports excellent compliance with treatment. She is not having side effects. .  She is experiencing the following symptoms: difficulty concentrating.  -----------------------------------------------------------------------    Allergies  Allergen Reactions  . Codeine Nausea And Vomiting     Current Outpatient Prescriptions:  .  ALPRAZolam (XANAX) 0.5 MG tablet, Take 1 tab PO during the day prn for anxiety; 2 tabs PO q h.s. Prn for sleep, Disp: 90 tablet, Rfl: 5 .  Cholecalciferol (VITAMIN D3) 2000 UNITS TABS, Take 1 tablet by mouth daily., Disp: , Rfl:  .  escitalopram (LEXAPRO) 10 MG tablet, Take 1 tablet (10 mg total) by mouth daily., Disp: 30 tablet, Rfl: 6 .  ferrous sulfate (IRON SUPPLEMENT) 325 (65 FE) MG tablet,  Take 1 tablet by mouth daily., Disp: , Rfl:  .  fluticasone (FLONASE) 50 MCG/ACT nasal spray, Place 2 sprays into both nostrils daily., Disp: 16 g, Rfl: 6 .  GREEN TEA, CAMILLIA SINENSIS, PO, Take 1 tablet by mouth daily., Disp: , Rfl:  .  MULTIPLE VITAMIN PO, Take 1 tablet by mouth daily., Disp: , Rfl:  .  Omega-3 Fatty Acids (FISH OIL CONCENTRATE) 435 MG CAPS, Take 1 capsule by mouth daily., Disp: , Rfl:  .  OVER THE COUNTER MEDICATION, Take by mouth 1 day or 1 dose., Disp: , Rfl:  .  vitamin E 400 UNIT capsule, Take 200 Units by mouth daily., Disp: , Rfl:  .  Zinc Sulfate (ZINC 15 PO), Take 1 tablet by mouth daily., Disp: , Rfl:  .  azithromycin (ZITHROMAX Z-PAK) 250 MG tablet, 2 pills today then 1 pill a day for 4 days (Patient not taking: Reported on 01/09/2017), Disp: 6 each, Rfl: 0 .  meloxicam (MOBIC) 15 MG tablet, Take 1 tablet (15 mg total) by mouth daily. (Patient not taking: Reported on 01/09/2017), Disp: 30 tablet, Rfl: 1 .  predniSONE (DELTASONE) 10 MG tablet, Take 3 tablets (30 mg total) by mouth daily with breakfast. (Patient not taking: Reported on 01/09/2017), Disp: 9 tablet, Rfl: 0  Review of Systems  Constitutional: Negative.   Respiratory: Negative.   Cardiovascular: Negative.   Gastrointestinal: Negative.   Psychiatric/Behavioral: Positive for decreased concentration and dysphoric mood. Negative for self-injury and suicidal ideas. The patient is nervous/anxious.     Social History  Substance Use Topics  . Smoking status: Former Smoker    Packs/day: 1.00    Years: 5.00    Types: Cigarettes  . Smokeless tobacco: Never Used     Comment: QUIT IN 2011  . Alcohol use No   Objective:   BP 138/80 (BP Location: Left Arm, Patient Position: Sitting, Cuff Size: Normal)   Pulse 71   Temp 98.3 F (36.8 C) (Oral)   Resp 16   Wt 232 lb 3.2 oz (105.3 kg)   BMI 32.39 kg/m      Physical Exam  Constitutional: She appears well-developed and well-nourished. No distress.  Neck:  Normal range of motion. Neck supple.  Cardiovascular: Normal rate, regular rhythm and normal heart sounds.  Exam reveals no gallop and no friction rub.   No murmur heard. Pulmonary/Chest: Effort normal and breath sounds normal. No respiratory distress. She has no wheezes. She has no rales.  Skin: She is not diaphoretic.  Psychiatric: Her speech is normal and behavior is normal. Judgment and thought content normal. Cognition and memory are normal. She exhibits a depressed mood.  Vitals reviewed.   Depression screen PHQ 2/9 01/09/2017  Decreased Interest 2  Down, Depressed, Hopeless 1  PHQ - 2 Score 3  Altered sleeping 2  Tired, decreased energy 2  Change in appetite 2  Feeling bad or failure about yourself  0  Trouble concentrating 2  Moving slowly or fidgety/restless 0  Suicidal thoughts 0  PHQ-9 Score 11  Difficult doing work/chores Very difficult      Assessment & Plan:     1. Grief reaction Worsening recently. Will increase Lexapro to 20mg  as below. She is to call the office if symptoms do not improve or worsen in the next 4 weeks. We also discussed grief counseling. She reports she is interested in going to a grief counseling session at [facility]  If she does well, I will see her back in November for her CPE.  - escitalopram (LEXAPRO) 20 MG tablet; Take 1 tablet (20 mg total) by mouth daily.  Dispense: 30 tablet; Refill: 5  2. Depression, major, single episode, moderate ([place]) See above medical treatment plan.  3. Anxiety Stable. Diagnosis pulled for medication refill. Continue current medical treatment plan. - ALPRAZolam (XANAX) 0.5 MG tablet; Take 1 tab PO during the day prn for anxiety; 2 tabs PO q h.s. Prn for sleep  Dispense: 90 tablet; Refill: 5  4. Tendinitis of ankle Stable. Diagnosis pulled for medication refill. Continue current medical treatment plan. - meloxicam (MOBIC) 15 MG tablet; Take 1 tablet (15 mg total) by mouth daily.  Dispense: 30 tablet; Refill:  [place], PA-C  [facility]

## 2017-07-28 ENCOUNTER — Encounter: Payer: Self-pay | Admitting: Physician Assistant

## 2017-07-28 ENCOUNTER — Ambulatory Visit (INDEPENDENT_AMBULATORY_CARE_PROVIDER_SITE_OTHER): Payer: Managed Care, Other (non HMO) | Admitting: Physician Assistant

## 2017-07-28 VITALS — BP 122/70 | HR 68 | Temp 98.2°F | Resp 16 | Ht 71.0 in | Wt 232.0 lb

## 2017-07-28 DIAGNOSIS — J301 Allergic rhinitis due to pollen: Secondary | ICD-10-CM

## 2017-07-28 DIAGNOSIS — Z13220 Encounter for screening for lipoid disorders: Secondary | ICD-10-CM

## 2017-07-28 DIAGNOSIS — F4321 Adjustment disorder with depressed mood: Secondary | ICD-10-CM

## 2017-07-28 DIAGNOSIS — Z1211 Encounter for screening for malignant neoplasm of colon: Secondary | ICD-10-CM

## 2017-07-28 DIAGNOSIS — E039 Hypothyroidism, unspecified: Secondary | ICD-10-CM | POA: Diagnosis not present

## 2017-07-28 DIAGNOSIS — Z0000 Encounter for general adult medical examination without abnormal findings: Secondary | ICD-10-CM

## 2017-07-28 DIAGNOSIS — E559 Vitamin D deficiency, unspecified: Secondary | ICD-10-CM | POA: Diagnosis not present

## 2017-07-28 DIAGNOSIS — F419 Anxiety disorder, unspecified: Secondary | ICD-10-CM

## 2017-07-28 DIAGNOSIS — Z1231 Encounter for screening mammogram for malignant neoplasm of breast: Secondary | ICD-10-CM | POA: Diagnosis not present

## 2017-07-28 DIAGNOSIS — Z1239 Encounter for other screening for malignant neoplasm of breast: Secondary | ICD-10-CM

## 2017-07-28 DIAGNOSIS — Z124 Encounter for screening for malignant neoplasm of cervix: Secondary | ICD-10-CM

## 2017-07-28 DIAGNOSIS — R7303 Prediabetes: Secondary | ICD-10-CM

## 2017-07-28 DIAGNOSIS — Z136 Encounter for screening for cardiovascular disorders: Secondary | ICD-10-CM

## 2017-07-28 DIAGNOSIS — I10 Essential (primary) hypertension: Secondary | ICD-10-CM

## 2017-07-28 LAB — POCT URINALYSIS DIPSTICK
APPEARANCE: NORMAL
Bilirubin, UA: NEGATIVE
Glucose, UA: NEGATIVE
Ketones, UA: NEGATIVE
LEUKOCYTES UA: NEGATIVE
Nitrite, UA: NEGATIVE
PH UA: 6 (ref 5.0–8.0)
PROTEIN UA: NEGATIVE
RBC UA: NEGATIVE
Spec Grav, UA: <=1.005 — AB (ref 1.010–1.025)
UROBILINOGEN UA: 0.2 U/dL

## 2017-07-28 MED ORDER — ALPRAZOLAM 0.5 MG PO TABS: ORAL_TABLET | ORAL | 5 refills | Status: DC

## 2017-07-28 MED ORDER — FLUTICASONE PROPIONATE 50 MCG/ACT NA SUSP: 2.0000 | Freq: Every day | NASAL | 6 refills | Status: DC

## 2017-07-28 MED ORDER — ESCITALOPRAM OXALATE 20 MG PO TABS: 20.0000 mg | ORAL_TABLET | Freq: Every day | ORAL | 5 refills | Status: DC

## 2017-07-28 NOTE — Patient Instructions (Signed)

## 2017-07-28 NOTE — Progress Notes (Signed)
Patient: Lisa Ashley, Female    DOB: July 10, 1965, 52 y.o.   MRN: [id number] Visit Date: 07/28/2017  Today's Provider: Mar Daring, PA-C   Chief Complaint  Patient presents with  . Annual Exam   Subjective:    Annual physical exam Lisa Ashley is a 52 y.o. female who presents today for health maintenance and complete physical. She feels well. She reports exercising 3 days 35 minutes. She reports she is sleeping fairly well.  05/30/16 CPE 06/02/16 Pap-neg; HPV-pos (GYN) 06/17/16 Mammogram-BI-RADS 1 -----------------------------------------------------------------   Review of Systems  Constitutional: Negative.   HENT: Negative.   Eyes: Negative.   Respiratory: Negative.   Cardiovascular: Negative.   Gastrointestinal: Negative.   Endocrine: Negative.   Genitourinary: Negative.   Musculoskeletal: Negative.   Skin: Negative.   Allergic/Immunologic: Negative.   Neurological: Negative.   Hematological: Negative.   Psychiatric/Behavioral: Negative.     Social History      She  reports that she has quit smoking. Her smoking use included cigarettes. She has a 5.00 pack-year smoking history. she has never used smokeless tobacco. She reports that she does not drink alcohol or use drugs.       Social History   Socioeconomic History  . Marital status: Single    Spouse name: None  . Number of children: None  . Years of education: None  . Highest education level: None  Social Needs  . Financial resource strain: None  . Food insecurity - worry: None  . Food insecurity - inability: None  . Transportation needs - medical: None  . Transportation needs - non-medical: None  Occupational History  . None  Tobacco Use  . Smoking status: Former Smoker    Packs/day: 1.00    Years: 5.00    Pack years: 5.00    Types: Cigarettes  . Smokeless tobacco: Never Used  . Tobacco comment: QUIT IN 2011  Substance and Sexual Activity  . Alcohol use: No    Alcohol/week: 0.0 oz    . Drug use: No  . Sexual activity: Yes    Birth control/protection: Surgical  Other Topics Concern  . None  Social History Narrative  . None    No past medical history on file.   Patient Active Problem List   Diagnosis Date Noted  . Alopecia 12/15/2014  . Obesity 12/15/2014  . ASCUS with positive high risk HPV 12/15/2014  . Hypertension 12/15/2014  . Uterine fibroid 12/15/2014  . 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 Genital herpes 12/15/2014  . Anxiety 12/15/2014  . Depression 12/15/2014  . Vitamin D deficiency 12/15/2014  . High risk sexual behavior 12/15/2014  . Pre-diabetes 12/15/2014    Past Surgical History:  Procedure Laterality Date  . CESAREAN SECTION  1991  . LAPAROSCOPIC SUPRACERVICAL HYSTERECTOMY  2012  . RIGHT OOPHORECTOMY  2012    Family History        Family Status  Relation Name Status  . Mother  Deceased at age 76       2016-10-04  . Father  Deceased  . Brother  Deceased  . MGM  Deceased  . MGF  Deceased  . PGM  Deceased  . PGF  Deceased        Her family history includes Breast cancer in her mother; Cancer in her maternal grandmother; Heart attack in her father; Heart disease in her mother; Kidney disease in her mother; Stroke in her brother.     Allergies  Allergen Reactions  . Codeine Nausea And Vomiting     Current Outpatient Medications:  .  ALPRAZolam (XANAX) 0.5 MG tablet, Take 1 tab PO during the day prn for anxiety; 2 tabs PO q h.s. Prn for sleep, Disp: 90 tablet, Rfl: 5 .  Cholecalciferol (VITAMIN D3) 2000 UNITS TABS, Take 1 tablet by mouth daily., Disp: , Rfl:  .  escitalopram (LEXAPRO) 20 MG tablet, Take 1 tablet (20 mg total) by mouth daily., Disp: 30 tablet, Rfl: 5 .  ferrous sulfate (IRON SUPPLEMENT) 325 (65 FE) MG tablet, Take 1 tablet by mouth daily., Disp: , Rfl:  .  fluticasone (FLONASE) 50 MCG/ACT nasal spray, Place 2 sprays into both nostrils daily., Disp: 16 g, Rfl: 6 .  meloxicam (MOBIC) 15 MG tablet, Take 1  tablet (15 mg total) by mouth daily., Disp: 30 tablet, Rfl: 5 .  MULTIPLE VITAMIN PO, Take 1 tablet by mouth daily., Disp: , Rfl:  .  Omega-3 Fatty Acids (FISH OIL CONCENTRATE) 435 MG CAPS, Take 1 capsule by mouth daily., Disp: , Rfl:  .  OVER THE COUNTER MEDICATION, Take by mouth 1 day or 1 dose., Disp: , Rfl:  .  vitamin E 400 UNIT capsule, Take 200 Units by mouth daily., Disp: , Rfl:  .  Zinc Sulfate (ZINC 15 PO), Take 1 tablet by mouth daily., Disp: , Rfl:    Patient Care Team: Rubye Beach as PCP - General (Physician Assistant)      Objective:   Vitals: BP 122/70 (BP Location: Left Arm, Patient Position: Sitting, Cuff Size: Large)   Pulse 68   Temp 98.2 F (36.8 C) (Oral)   Resp 16   Ht 5\' 11"  (1.803 m)   Wt 232 lb (105.2 kg)   SpO2 97%   BMI 32.36 kg/m    Vitals:   07/28/17 1417  BP: 122/70  Pulse: 68  Resp: 16  Temp: 98.2 F (36.8 C)  TempSrc: Oral  SpO2: 97%  Weight: 232 lb (105.2 kg)  Height: 5\' 11"  (1.803 m)     Physical Exam  Constitutional: She is oriented to person, place, and time. She appears well-developed and well-nourished. No distress.  HENT:  Head: Normocephalic and atraumatic.  Right Ear: Hearing, tympanic membrane, external ear and ear canal normal.  Left Ear: Hearing, tympanic membrane, external ear and ear canal normal.  Nose: Nose normal.  Mouth/Throat: Uvula is midline, oropharynx is clear and moist and mucous membranes are normal. No oropharyngeal exudate.  Eyes: Conjunctivae and EOM are normal. Pupils are equal, round, and reactive to light. Right eye exhibits no discharge. Left eye exhibits no discharge. No scleral icterus.  Neck: Normal range of motion. Neck supple. No JVD present. Carotid bruit is not present. No tracheal deviation present. No thyromegaly present.  Cardiovascular: Normal rate, regular rhythm, normal heart sounds and intact distal pulses. Exam reveals no gallop and no friction rub.  No murmur  heard. Pulmonary/Chest: Effort normal and breath sounds normal. No respiratory distress. She has no wheezes. She has no rales. She exhibits no tenderness. Right breast exhibits no inverted nipple, no mass, no nipple discharge, no skin change and no tenderness. Left breast exhibits no inverted nipple, no mass, no nipple discharge, no skin change and no tenderness. Breasts are symmetrical.  Abdominal: Soft. Bowel sounds are normal. She exhibits no distension and no mass. There is no tenderness. There is no rebound and no guarding. Hernia confirmed negative in the right inguinal area and confirmed negative  in the left inguinal area.  Genitourinary: Rectum normal, vagina normal and uterus normal. No breast swelling, tenderness, discharge or bleeding. Pelvic exam was performed with patient supine. There is no rash, tenderness, lesion or injury on the right labia. There is no rash, tenderness, lesion or injury on the left labia. Cervix exhibits no motion tenderness, no discharge and no friability. Right adnexum displays no mass, no tenderness and no fullness. Left adnexum displays no mass, no tenderness and no fullness. No erythema, tenderness or bleeding in the vagina. No signs of injury around the vagina. No vaginal discharge found.  Musculoskeletal: Normal range of motion. She exhibits no edema or tenderness.  Lymphadenopathy:    She has no cervical adenopathy.       Right: No inguinal adenopathy present.       Left: No inguinal adenopathy present.  Neurological: She is alert and oriented to person, place, and time. She has normal reflexes. No cranial nerve deficit. Coordination normal.  Skin: Skin is warm and dry. No rash noted. She is not diaphoretic.  Psychiatric: She has a normal mood and affect. Her behavior is normal. Judgment and thought content normal.  Vitals reviewed.    Depression Screen PHQ 2/9 Scores 07/28/2017 01/09/2017 09/19/2016  PHQ - 2 Score 2 3 4   PHQ- 9 Score 8 11 20        Assessment & Plan:     Routine Health Maintenance and Physical Exam  Exercise Activities and Dietary recommendations Goals    None      Immunization History  Administered Date(s) Administered  . Tdap 01/19/2012    Health Maintenance  Topic Date Due  . COLONOSCOPY  02/01/2017  . INFLUENZA VACCINE  02/25/2018 (Originally 02/01/2017)  . MAMMOGRAM  06/17/2018  . PAP SMEAR  06/03/2019  . TETANUS/TDAP  01/18/2022  . HIV Screening  Completed     Discussed health benefits of physical activity, and encouraged her to engage in regular exercise appropriate for her age and condition.    1. Annual physical exam Normal physical exam today. Will check labs as below and f/u pending lab results. If labs are stable and WNL she will not need to have these rechecked for one year at her next annual physical exam. She is to call the office in the meantime if she has any acute issue, questions or concerns. UA normal.  - POCT urinalysis dipstick  2. Cervical cancer screening Pap collected today. Will send as below and f/u pending results. - Pap IG and HPV (high risk) DNA detection  3. Colon cancer screening No previous colonoscopy. No family history of colon cancer. Patient desires cologuard testing first. - Cologuard  4. Breast cancer screening Breast exam today was normal. There is family history of breast cancer in her mother. She does perform regular self breast exams. Mammogram was ordered as below. Information for [facility] was given to patient so she may schedule her mammogram at her convenience.  5. Hypothyroidism, unspecified type Stable. Not symptomatic. Will check labs as below and f/u pending results. - TSH  6. Essential hypertension Stable. Will check labs as below and f/u pending results. - CBC with Differential/Platelet - Comprehensive metabolic panel  7. Vitamin D deficiency H/O this. Will check labs as below and f/u pending results. -  VITAMIN D 25 Hydroxy (Vit-D Deficiency, Fractures)  8. Pre-diabetes Diet controlled. Will check labs as below and f/u pending results. - Hemoglobin A1c  9. Encounter for lipid screening for cardiovascular disease  Will check labs as below and f/u pending results. - Lipid Panel With LDL/HDL Ratio  10. Grief reaction Stable. Diagnosis pulled for medication refill. Continue current medical treatment plan. - escitalopram (LEXAPRO) 20 MG tablet; Take 1 tablet (20 mg total) by mouth daily.  Dispense: 30 tablet; Refill: 5  11. Anxiety Stable. Diagnosis pulled for medication refill. Continue current medical treatment plan. - ALPRAZolam (XANAX) 0.5 MG tablet; Take 1 tab PO during the day prn for anxiety; 2 tabs PO q h.s. Prn for sleep  Dispense: 90 tablet; Refill: 5  12. Non-seasonal allergic rhinitis due to pollen Stable. Diagnosis pulled for medication refill. Continue current medical treatment plan. - fluticasone (FLONASE) 50 MCG/ACT nasal spray; Place 2 sprays into both nostrils daily.  Dispense: 16 g; Refill: 6  --------------------------------------------------------------------    Mar Daring, PA-C  [facility]

## 2017-07-29 LAB — LIPID PANEL WITH LDL/HDL RATIO
Cholesterol, Total: 175 mg/dL (ref 100–199)
HDL: 39 mg/dL — AB (ref >39)
LDL Calculated: 104 mg/dL — ABNORMAL HIGH (ref 0–99)
LDL/HDL RATIO: 2.7 ratio (ref 0.0–3.2)
Triglycerides: 161 mg/dL — ABNORMAL HIGH (ref 0–149)
VLDL CHOLESTEROL CAL: 32 mg/dL (ref 5–40)

## 2017-07-29 LAB — COMPREHENSIVE METABOLIC PANEL
ALK PHOS: 100 IU/L (ref 39–117)
ALT: 22 IU/L (ref 0–32)
AST: 23 IU/L (ref 0–40)
Albumin/Globulin Ratio: 1.6 (ref 1.2–2.2)
Albumin: 4.7 g/dL (ref 3.5–5.5)
BILIRUBIN TOTAL: 0.3 mg/dL (ref 0.0–1.2)
BUN/Creatinine Ratio: 22 (ref 9–23)
BUN: 13 mg/dL (ref 6–24)
CO2: 22 mmol/L (ref 20–29)
CREATININE: 0.6 mg/dL (ref 0.57–1.00)
Calcium: 9.5 mg/dL (ref 8.7–10.2)
Chloride: 102 mmol/L (ref 96–106)
GFR calc Af Amer: 123 mL/min/{1.73_m2} (ref >59)
GFR calc non Af Amer: 107 mL/min/{1.73_m2} (ref >59)
GLOBULIN, TOTAL: 2.9 g/dL (ref 1.5–4.5)
Glucose: 109 mg/dL — ABNORMAL HIGH (ref 65–99)
POTASSIUM: 4.2 mmol/L (ref 3.5–5.2)
SODIUM: 140 mmol/L (ref 134–144)
Total Protein: 7.6 g/dL (ref 6.0–8.5)

## 2017-07-29 LAB — CBC WITH DIFFERENTIAL/PLATELET
BASOS ABS: 0 10*3/uL (ref 0.0–0.2)
Basos: 0 %
EOS (ABSOLUTE): 0.2 10*3/uL (ref 0.0–0.4)
EOS: 3 %
HEMATOCRIT: 34.5 % (ref 34.0–46.6)
Hemoglobin: 11.7 g/dL (ref 11.1–15.9)
Immature Grans (Abs): 0 10*3/uL (ref 0.0–0.1)
Immature Granulocytes: 0 %
LYMPHS ABS: 2.7 10*3/uL (ref 0.7–3.1)
Lymphs: 36 %
MCH: 30.2 pg (ref 26.6–33.0)
MCHC: 33.9 g/dL (ref 31.5–35.7)
MCV: 89 fL (ref 79–97)
Monocytes Absolute: 0.5 10*3/uL (ref 0.1–0.9)
Monocytes: 6 %
Neutrophils Absolute: 4.2 10*3/uL (ref 1.4–7.0)
Neutrophils: 55 %
Platelets: 313 10*3/uL (ref 150–379)
RBC: 3.88 x10E6/uL (ref 3.77–5.28)
RDW: 13.3 % (ref 12.3–15.4)
WBC: 7.6 10*3/uL (ref 3.4–10.8)

## 2017-07-29 LAB — HEMOGLOBIN A1C
ESTIMATED AVERAGE GLUCOSE: 123 mg/dL
Hgb A1c MFr Bld: 5.9 % — ABNORMAL HIGH (ref 4.8–5.6)

## 2017-07-29 LAB — TSH: TSH: 2.33 u[IU]/mL (ref 0.450–4.500)

## 2017-07-29 LAB — VITAMIN D 25 HYDROXY (VIT D DEFICIENCY, FRACTURES): VIT D 25 HYDROXY: 37.3 ng/mL (ref 30.0–100.0)

## 2017-08-01 LAB — PAP IG AND HPV HIGH-RISK
HPV, high-risk: POSITIVE — AB
PAP SMEAR COMMENT: 0

## 2017-08-02 ENCOUNTER — Telehealth: Payer: Self-pay

## 2017-08-02 DIAGNOSIS — R87618 Other abnormal cytological findings on specimens from cervix uteri: Secondary | ICD-10-CM

## 2017-08-02 DIAGNOSIS — R8789 Other abnormal findings in specimens from female genital organs: Secondary | ICD-10-CM

## 2017-08-02 DIAGNOSIS — R87612 Low grade squamous intraepithelial lesion on cytologic smear of cervix (LGSIL): Secondary | ICD-10-CM

## 2017-08-02 NOTE — Telephone Encounter (Signed)
Patient advised as directed below.Patient used to see Dr. Marcelline Mates at [facility].  Thanks,  -Teriyah Purington

## 2017-08-02 NOTE — Telephone Encounter (Signed)
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 I will refer to GYN for further evaluation. Is there any particular GYN you would like to see before I place the referral.   Also all labs are stable with exception of A1c. This has increased from 5.6 to 5.9. Limit sugar and carbs as you have begun to do already.

## 2017-08-02 NOTE — Telephone Encounter (Signed)
LMTCB  Thanks,  -Braylee Bosher 

## 2017-08-03 NOTE — Telephone Encounter (Signed)
Referral placed.

## 2017-08-04 LAB — HM MAMMOGRAPHY

## 2017-08-08 ENCOUNTER — Encounter: Payer: Self-pay | Admitting: Physician Assistant

## 2017-08-08 ENCOUNTER — Telehealth: Payer: Self-pay

## 2017-08-08 NOTE — Telephone Encounter (Signed)
Called patient that per [facility] her mammogram results are normal. Repeat in 1 year.  Thanks,  -Linsay Vogt

## 2017-08-15 ENCOUNTER — Telehealth: Payer: Self-pay | Admitting: Physician Assistant

## 2017-08-15 NOTE — Telephone Encounter (Signed)
Order for cologuard faxed to [facility] °

## 2017-08-22 ENCOUNTER — Ambulatory Visit (INDEPENDENT_AMBULATORY_CARE_PROVIDER_SITE_OTHER): Payer: Managed Care, Other (non HMO) | Admitting: Obstetrics and Gynecology

## 2017-08-22 ENCOUNTER — Encounter: Payer: Self-pay | Admitting: Obstetrics and Gynecology

## 2017-08-22 VITALS — BP 124/78 | HR 91 | Ht 71.0 in | Wt 231.0 lb

## 2017-08-22 DIAGNOSIS — R87612 Low grade squamous intraepithelial lesion on cytologic smear of cervix (LGSIL): Secondary | ICD-10-CM | POA: Diagnosis not present

## 2017-08-22 NOTE — Progress Notes (Signed)
    GYNECOLOGY CLINIC COLPOSCOPY PROCEDURE NOTE  52 y.o. G1P1001 here for colposcopy for low-grade squamous intraepithelial neoplasia (LGSIL - encompassing HPV,mild dysplasia,CIN I) pap smear on 07/28/2017. Discussed role for HPV in cervical dysplasia, need for surveillance.  Patient given informed consent, signed copy in the chart, time out was performed.  Placed in lithotomy position. Cervix viewed with speculum and colposcope after application of acetic acid.   Colposcopy adequate? No . Unable to see transformation zone due to cervical stenosis.   no mosaicism, no punctation, no abnormal vasculature and acetowhite lesion(s) noted at 9 o'clock; corresponding biopsies obtained.  ECC specimen attempted to be obtained but difficulty penetrating cervical os. All specimens were labeled and sent to pathology.   Patient was given post procedure instructions.  Will follow up pathology and manage accordingly; patient will be contacted with results and recommendations.  Routine preventative health maintenance measures emphasized.    Rubie Maid, MD [facility]

## 2017-08-22 NOTE — Addendum Note (Signed)
Addended by: Edwyna Shell on: 08/22/2017 04:56 PM   Modules accepted: Orders

## 2017-08-22 NOTE — Progress Notes (Signed)
Pt is doing well.

## 2017-08-24 LAB — PATHOLOGY

## 2017-09-06 LAB — COLOGUARD: COLOGUARD: NEGATIVE

## 2017-11-23 ENCOUNTER — Telehealth: Payer: Self-pay

## 2017-11-23 NOTE — Telephone Encounter (Signed)
Patient called requesting a letter stating her diagnosis of depression and anxiety worsening since last week. Patient reports that she tested positive for marijuana and is being fired from her job. She hopes that this letter will help her case. Patient reports that last year her mother passed away and she was not told and that made her symptoms worse.  CB# [phone]

## 2017-11-24 NOTE — Telephone Encounter (Signed)
Letter printed.

## 2017-11-24 NOTE — Telephone Encounter (Signed)
Patient advised as below. Patient reports that she was let go from her job yesterday, and will not need the letter. Sonia Baller aware.

## 2019-07-12 ENCOUNTER — Encounter: Payer: Managed Care, Other (non HMO) | Admitting: Physician Assistant

## 2019-07-15 NOTE — Progress Notes (Signed)
Patient: Lisa Ashley, Female    DOB: February 07, 1966, 54 y.o.   MRN: [id number] Visit Date: 07/18/2019  Today's Provider: Mar Daring, PA-C   Chief Complaint  Patient presents with  . Annual Exam   Subjective:     Annual physical exam Lisa Ashley is a 54 y.o. female who presents today for health maintenance and complete physical. She feels well. She reports not exercising. She reports she is sleeping poorly.  -----------------------------------------------------------------  Pt would like to restart her anxiety medications.   09/11/17-Cologuard is negative. Will repeat in 3 years 07/28/17-Pap is abnormal for low grade cell changes and is HPV positive.Refered to GYN for further evaluation 08/07/2017-Mammogram Normal  Review of Systems  Constitutional: Negative.   HENT: Negative.   Eyes: Negative.   Respiratory: Negative.   Cardiovascular: Negative.   Gastrointestinal: Negative.   Endocrine: Negative.   Genitourinary: Negative.   Musculoskeletal: Negative.   Psychiatric/Behavioral: Positive for decreased concentration.    Social History      She  reports that she has quit smoking. Her smoking use included cigarettes. She has a 5.00 pack-year smoking history. She has never used smokeless tobacco. She reports that she does not drink alcohol or use drugs.       Social History   Socioeconomic History  . Marital status: Single    Spouse name: Not on file  . Number of children: Not on file  . Years of education: Not on file  . Highest education level: Not on file  Occupational History  . Not on file  Tobacco Use  . Smoking status: Former Smoker    Packs/day: 1.00    Years: 5.00    Pack years: 5.00    Types: Cigarettes  . Smokeless tobacco: Never Used  . Tobacco comment: QUIT IN 2011  Substance and Sexual Activity  . Alcohol use: No    Alcohol/week: 0.0 standard drinks  . Drug use: No  . Sexual activity: Never    Birth control/protection: Surgical  Other  Topics Concern  . Not on file  Social History Narrative  . Not on file   Social Determinants of Health   Financial Resource Strain:   . Difficulty of Paying Living Expenses: Not on file  Food Insecurity:   . Worried About Charity fundraiser in the Last Year: Not on file  . Ran Out of Food in the Last Year: Not on file  Transportation Needs:   . Lack of Transportation (Medical): Not on file  . Lack of Transportation (Non-Medical): Not on file  Physical Activity:   . Days of Exercise per Week: Not on file  . Minutes of Exercise per Session: Not on file  Stress:   . Feeling of Stress : Not on file  Social Connections:   . Frequency of Communication with Friends and Family: Not on file  . Frequency of Social Gatherings with Friends and Family: Not on file  . Attends Religious Services: Not on file  . Active Member of Clubs or Organizations: Not on file  . Attends Archivist Meetings: Not on file  . Marital Status: Not on file    History reviewed. No pertinent past medical history.   Patient Active Problem List   Diagnosis Date Noted  . Alopecia 12/15/2014  . Obesity 12/15/2014  . ASCUS with positive high risk human papillomavirus of vagina 12/15/2014  . Hypertension 12/15/2014  . Uterine fibroid 12/15/2014  . Herpes simplex labialis  12/15/2014  . Genital herpes 12/15/2014  . Anxiety 12/15/2014  . Depression 12/15/2014  . Vitamin D deficiency 12/15/2014  . High risk sexual behavior 12/15/2014  . Pre-diabetes 12/15/2014    Past Surgical History:  Procedure Laterality Date  . CESAREAN SECTION  1991  . LAPAROSCOPIC SUPRACERVICAL HYSTERECTOMY  2012  . RIGHT OOPHORECTOMY  2012    Family History        Family Status  Relation Name Status  . Mother  Deceased at age 44       09/11/2016  . Father  Deceased  . Brother  Deceased  . MGM  Deceased  . MGF  Deceased  . PGM  Deceased  . PGF  Deceased        Her family history includes Breast cancer in her  mother; Cancer in her maternal grandmother; Heart attack in her father; Heart disease in her mother; Kidney disease in her mother; Stroke in her brother.      Allergies  Allergen Reactions  . Codeine Nausea And Vomiting     Current Outpatient Medications:  .  Cholecalciferol (VITAMIN D3) 2000 UNITS TABS, Take 1 tablet by mouth daily., Disp: , Rfl:  .  Emollient (COLLAGEN EX), Apply topically., Disp: , Rfl:  .  ferrous sulfate (IRON SUPPLEMENT) 325 (65 FE) MG tablet, Take 1 tablet by mouth daily., Disp: , Rfl:  .  MULTIPLE VITAMIN PO, Take 1 tablet by mouth daily., Disp: , Rfl:  .  Omega-3 Fatty Acids (FISH OIL CONCENTRATE) 435 MG CAPS, Take 1 capsule by mouth daily., Disp: , Rfl:  .  OVER THE COUNTER MEDICATION, Take by mouth 1 day or 1 dose., Disp: , Rfl:  .  vitamin E 400 UNIT capsule, Take 200 Units by mouth daily., Disp: , Rfl:  .  Zinc Sulfate (ZINC 15 PO), Take 1 tablet by mouth daily., Disp: , Rfl:  .  ALPRAZolam (XANAX) 0.5 MG tablet, Take 1 tab PO during the day prn for anxiety; 2 tabs PO q h.s. Prn for sleep (Patient not taking: Reported on 07/18/2019), Disp: 90 tablet, Rfl: 5 .  escitalopram (LEXAPRO) 20 MG tablet, Take 1 tablet (20 mg total) by mouth daily. (Patient not taking: Reported on 07/18/2019), Disp: 30 tablet, Rfl: 5 .  fluticasone (FLONASE) 50 MCG/ACT nasal spray, Place 2 sprays into both nostrils daily. (Patient not taking: Reported on 07/18/2019), Disp: 16 g, Rfl: 6 .  meloxicam (MOBIC) 15 MG tablet, Take 1 tablet (15 mg total) by mouth daily. (Patient not taking: Reported on 07/18/2019), Disp: 30 tablet, Rfl: 5   Patient Care Team: Mar Daring, PA-C as PCP - General (Physician Assistant)    Objective:    Vitals: BP (!) 166/92 (BP Location: Left Arm, Patient Position: Sitting, Cuff Size: Large)   Pulse 68   Temp (!) 96.8 F (36 C) (Temporal)   Ht 5\' 11"  (1.803 m)   Wt 230 lb (104.3 kg)   BMI 32.08 kg/m    Vitals:   07/18/19 0746  BP: (!) 166/92    Pulse: 68  Temp: (!) 96.8 F (36 C)  TempSrc: Temporal  Weight: 230 lb (104.3 kg)  Height: 5\' 11"  (1.803 m)     Physical Exam Vitals reviewed. Exam conducted with a chaperone present.  Constitutional:      General: She is not in acute distress.    Appearance: Normal appearance. She is well-developed. She is obese. She is not ill-appearing or diaphoretic.  HENT:  Head: Normocephalic and atraumatic.     Right Ear: Hearing, tympanic membrane, ear canal and external ear normal.     Left Ear: Hearing, tympanic membrane, ear canal and external ear normal.     Mouth/Throat:     Pharynx: Uvula midline.  Eyes:     General: No scleral icterus.       Right eye: No discharge.        Left eye: No discharge.     Extraocular Movements: Extraocular movements intact.     Conjunctiva/sclera: Conjunctivae normal.     Pupils: Pupils are equal, round, and reactive to light.  Neck:     Thyroid: No thyromegaly.     Vascular: No carotid bruit or JVD.     Trachea: No tracheal deviation.  Cardiovascular:     Rate and Rhythm: Normal rate and regular rhythm.     Pulses: Normal pulses.     Heart sounds: Normal heart sounds. No murmur. No friction rub. No gallop.   Pulmonary:     Effort: Pulmonary effort is normal. No respiratory distress.     Breath sounds: Normal breath sounds. No wheezing or rales.  Chest:     Chest wall: No tenderness.     Breasts: Breasts are symmetrical.        Right: No inverted nipple, mass, nipple discharge, skin change or tenderness.        Left: No inverted nipple, mass, nipple discharge, skin change or tenderness.  Abdominal:     General: Abdomen is flat. Bowel sounds are normal. There is no distension.     Palpations: Abdomen is soft. There is no mass.     Tenderness: There is no abdominal tenderness. There is no guarding or rebound.     Hernia: There is no hernia in the left inguinal area.  Genitourinary:    General: Normal vulva.     Exam position: Supine.      Labia:        Right: No rash, tenderness, lesion or injury.        Left: No rash, tenderness, lesion or injury.      Urethra: No urethral swelling or urethral lesion.     Vagina: Normal. No signs of injury. No vaginal discharge, erythema, tenderness or bleeding.     Cervix: No cervical motion tenderness, discharge or friability.     Uterus: Absent.      Adnexa:        Right: No mass, tenderness or fullness.         Left: No mass, tenderness or fullness.       Rectum: Normal.     Comments: Uterus surgically absent; one ovary surgically removed but patient cannot recall which side but neither were palpable today Musculoskeletal:        General: No tenderness. Normal range of motion.     Cervical back: Normal range of motion and neck supple.     Right lower leg: No edema.     Left lower leg: No edema.  Lymphadenopathy:     Cervical: No cervical adenopathy.  Skin:    General: Skin is warm and dry.     Capillary Refill: Capillary refill takes less than 2 seconds.     Findings: No rash.  Neurological:     General: No focal deficit present.     Mental Status: She is alert and oriented to person, place, and time. Mental status is at baseline.     Cranial Nerves: No cranial nerve  deficit.     Coordination: Coordination normal.     Deep Tendon Reflexes: Reflexes are normal and symmetric.  Psychiatric:        Mood and Affect: Mood normal.        Behavior: Behavior normal.        Thought Content: Thought content normal.        Judgment: Judgment normal.      Depression Screen PHQ 2/9 Scores 07/18/2019 07/28/2017 01/09/2017 09/19/2016  PHQ - 2 Score 2 2 3 4   PHQ- 9 Score 14 8 11 20        Assessment & Plan:     Routine Health Maintenance and Physical Exam  Exercise Activities and Dietary recommendations Goals   None     Immunization History  Administered Date(s) Administered  . Tdap 01/19/2012    Health Maintenance  Topic Date Due  . MAMMOGRAM  08/04/2018  . INFLUENZA  VACCINE  10/02/2019 (Originally 02/02/2019)  . PAP SMEAR-Modifier  07/28/2020  . Fecal DNA (Cologuard)  08/29/2020  . TETANUS/TDAP  01/18/2022  . HIV Screening  Completed     Discussed health benefits of physical activity, and encouraged her to engage in regular exercise appropriate for her age and condition.    1. Annual physical exam Normal physical exam today. Will check labs as below and f/u pending lab results. If labs are stable and WNL she will not need to have these rechecked for one year at her next annual physical exam. She is to call the office in the meantime if she has any acute issue, questions or concerns. - CBC w/Diff/Platelet - Comprehensive Metabolic Panel (CMET) - TSH - HgB A1c - Lipid Profile  2. Encounter for breast cancer screening using non-mammogram modality Breast exam today was normal. There is no family history of breast cancer. She does perform regular self breast exams. Mammogram was ordered as below. Information for [facility] clinic was given to patient so she may schedule her mammogram at her convenience. - MM 3D SCREEN BREAST BILATERAL; Future  3. Screening for vaginal cancer Pap collected today. Will send as below and f/u pending results. - Cytology - PAP  4. Essential hypertension Elevated today. Will check labs as below and f/u pending results. - CBC w/Diff/Platelet - Comprehensive Metabolic Panel (CMET) - HgB A1c - Lipid Profile  5. ASCUS with positive high risk human papillomavirus of vagina Was cleared by GYN, Dr. Marcelline Mates in 2019. Will recheck.  - Cytology - PAP  6. Vitamin D deficiency H/O this. Will check labs as below and f/u pending results. - CBC w/Diff/Platelet - Vitamin D (25 hydroxy)  7. Pre-diabetes Will check labs as below and f/u pending results. - CBC w/Diff/Platelet - Comprehensive Metabolic Panel (CMET) - HgB A1c  --------------------------------------------------------------------    Mar Daring, PA-C    [facility]

## 2019-07-18 ENCOUNTER — Ambulatory Visit (INDEPENDENT_AMBULATORY_CARE_PROVIDER_SITE_OTHER): Payer: BC Managed Care – PPO | Admitting: Physician Assistant

## 2019-07-18 ENCOUNTER — Other Ambulatory Visit (HOSPITAL_COMMUNITY)
Admission: RE | Admit: 2019-07-18 | Discharge: 2019-07-18 | Disposition: A | Discharge status: Home / Self Care | Payer: BC Managed Care – PPO | Source: Ambulatory Visit | Attending: Physician Assistant | Admitting: Physician Assistant

## 2019-07-18 ENCOUNTER — Other Ambulatory Visit: Payer: Self-pay

## 2019-07-18 ENCOUNTER — Encounter: Payer: Self-pay | Admitting: Physician Assistant

## 2019-07-18 VITALS — BP 166/92 | HR 68 | Temp 96.8°F | Ht 71.0 in | Wt 230.0 lb

## 2019-07-18 DIAGNOSIS — Z0000 Encounter for general adult medical examination without abnormal findings: Secondary | ICD-10-CM | POA: Diagnosis not present

## 2019-07-18 DIAGNOSIS — Z1272 Encounter for screening for malignant neoplasm of vagina: Secondary | ICD-10-CM | POA: Insufficient documentation

## 2019-07-18 DIAGNOSIS — R87811 Vaginal high risk human papillomavirus (HPV) DNA test positive: Secondary | ICD-10-CM | POA: Insufficient documentation

## 2019-07-18 DIAGNOSIS — R87620 Atypical squamous cells of undetermined significance on cytologic smear of vagina (ASC-US): Secondary | ICD-10-CM | POA: Diagnosis not present

## 2019-07-18 DIAGNOSIS — Z1239 Encounter for other screening for malignant neoplasm of breast: Secondary | ICD-10-CM | POA: Diagnosis not present

## 2019-07-18 DIAGNOSIS — E559 Vitamin D deficiency, unspecified: Secondary | ICD-10-CM

## 2019-07-18 DIAGNOSIS — R7303 Prediabetes: Secondary | ICD-10-CM | POA: Diagnosis not present

## 2019-07-18 DIAGNOSIS — I10 Essential (primary) hypertension: Secondary | ICD-10-CM

## 2019-07-18 NOTE — Patient Instructions (Signed)
Health Maintenance, Female Adopting a healthy lifestyle and getting preventive care are important in promoting health and wellness. Ask your health care provider about:  The right schedule for you to have regular tests and exams.  Things you can do on your own to prevent diseases and keep yourself healthy. What should I know about diet, weight, and exercise? Eat a healthy diet   Eat a diet that includes plenty of vegetables, fruits, low-fat dairy products, and lean protein.  Do not eat a lot of foods that are high in solid fats, added sugars, or sodium. Maintain a healthy weight Body mass index (BMI) is used to identify weight problems. It estimates body fat based on height and weight. Your health care provider can help determine your BMI and help you achieve or maintain a healthy weight. Get regular exercise Get regular exercise. This is one of the most important things you can do for your health. Most adults should:  Exercise for at least 150 minutes each week. The exercise should increase your heart rate and make you sweat (moderate-intensity exercise).  Do strengthening exercises at least twice a week. This is in addition to the moderate-intensity exercise.  Spend less time sitting. Even light physical activity can be beneficial. Watch cholesterol and blood lipids Have your blood tested for lipids and cholesterol at 53 years of age, then have this test every 5 years. Have your cholesterol levels checked more often if:  Your lipid or cholesterol levels are high.  You are older than 53 years of age.  You are at high risk for heart disease. What should I know about cancer screening? Depending on your health history and family history, you may need to have cancer screening at various ages. This may include screening for:  Breast cancer.  Cervical cancer.  Colorectal cancer.  Skin cancer.  Lung cancer. What should I know about heart disease, diabetes, and high blood  pressure? Blood pressure and heart disease  High blood pressure causes heart disease and increases the risk of stroke. This is more likely to develop in people who have high blood pressure readings, are of African descent, or are overweight.  Have your blood pressure checked: ? Every 3-5 years if you are 18-39 years of age. ? Every year if you are 40 years old or older. Diabetes Have regular diabetes screenings. This checks your fasting blood sugar level. Have the screening done:  Once every three years after age 40 if you are at a normal weight and have a low risk for diabetes.  More often and at a younger age if you are overweight or have a high risk for diabetes. What should I know about preventing infection? Hepatitis B If you have a higher risk for hepatitis B, you should be screened for this virus. Talk with your health care provider to find out if you are at risk for hepatitis B infection. Hepatitis C Testing is recommended for:  Everyone born from 1945 through 1965.  Anyone with known risk factors for hepatitis C. Sexually transmitted infections (STIs)  Get screened for STIs, including gonorrhea and chlamydia, if: ? You are sexually active and are younger than 53 years of age. ? You are older than 53 years of age and your health care provider tells you that you are at risk for this type of infection. ? Your sexual activity has changed since you were last screened, and you are at increased risk for chlamydia or gonorrhea. Ask your health care provider if   you are at risk.  Ask your health care provider about whether you are at high risk for HIV. Your health care provider may recommend a prescription medicine to help prevent HIV infection. If you choose to take medicine to prevent HIV, you should first get tested for HIV. You should then be tested every 3 months for as long as you are taking the medicine. Pregnancy  If you are about to stop having your period (premenopausal) and  you may become pregnant, seek counseling before you get pregnant.  Take 400 to 800 micrograms (mcg) of folic acid every day if you become pregnant.  Ask for birth control (contraception) if you want to prevent pregnancy. Osteoporosis and menopause Osteoporosis is a disease in which the bones lose minerals and strength with aging. This can result in bone fractures. If you are 65 years old or older, or if you are at risk for osteoporosis and fractures, ask your health care provider if you should:  Be screened for bone loss.  Take a calcium or vitamin D supplement to lower your risk of fractures.  Be given hormone replacement therapy (HRT) to treat symptoms of menopause. Follow these instructions at home: Lifestyle  Do not use any products that contain nicotine or tobacco, such as cigarettes, e-cigarettes, and chewing tobacco. If you need help quitting, ask your health care provider.  Do not use street drugs.  Do not share needles.  Ask your health care provider for help if you need support or information about quitting drugs. Alcohol use  Do not drink alcohol if: ? Your health care provider tells you not to drink. ? You are pregnant, may be pregnant, or are planning to become pregnant.  If you drink alcohol: ? Limit how much you use to 0-1 drink a day. ? Limit intake if you are breastfeeding.  Be aware of how much alcohol is in your drink. In the [place] one drink equals one 12 oz bottle of beer (355 mL), one 5 oz glass of wine (148 mL), or one 1 oz glass of hard liquor (44 mL). General instructions  Schedule regular health, dental, and eye exams.  Stay current with your vaccines.  Tell your health care provider if: ? You often feel depressed. ? You have ever been abused or do not feel safe at home. Summary  Adopting a healthy lifestyle and getting preventive care are important in promoting health and wellness.  Follow your health care provider's instructions about healthy  diet, exercising, and getting tested or screened for diseases.  Follow your health care provider's instructions on monitoring your cholesterol and blood pressure. This information is not intended to replace advice given to you by your health care provider. Make sure you discuss any questions you have with your health care provider. Document Revised: 06/13/2018 Document Reviewed: 06/13/2018 Elsevier Patient Education  2020 Elsevier Inc.  

## 2019-07-19 ENCOUNTER — Telehealth: Payer: Self-pay | Admitting: Physician Assistant

## 2019-07-19 DIAGNOSIS — F419 Anxiety disorder, unspecified: Secondary | ICD-10-CM

## 2019-07-19 DIAGNOSIS — F4321 Adjustment disorder with depressed mood: Secondary | ICD-10-CM

## 2019-07-19 LAB — VITAMIN D 25 HYDROXY (VIT D DEFICIENCY, FRACTURES): Vit D, 25-Hydroxy: 32.7 ng/mL (ref 30.0–100.0)

## 2019-07-19 LAB — COMPREHENSIVE METABOLIC PANEL
ALT: 21 IU/L (ref 0–32)
AST: 23 IU/L (ref 0–40)
Albumin/Globulin Ratio: 1.5 (ref 1.2–2.2)
Albumin: 4.7 g/dL (ref 3.8–4.9)
Alkaline Phosphatase: 109 IU/L (ref 39–117)
BUN/Creatinine Ratio: 20 (ref 9–23)
BUN: 14 mg/dL (ref 6–24)
Bilirubin Total: 0.4 mg/dL (ref 0.0–1.2)
CO2: 22 mmol/L (ref 20–29)
Calcium: 9.6 mg/dL (ref 8.7–10.2)
Chloride: 102 mmol/L (ref 96–106)
Creatinine, Ser: 0.69 mg/dL (ref 0.57–1.00)
GFR calc Af Amer: 116 mL/min/{1.73_m2} (ref >59)
GFR calc non Af Amer: 100 mL/min/{1.73_m2} (ref >59)
Globulin, Total: 3.1 g/dL (ref 1.5–4.5)
Glucose: 139 mg/dL — ABNORMAL HIGH (ref 65–99)
Potassium: 4 mmol/L (ref 3.5–5.2)
Sodium: 140 mmol/L (ref 134–144)
Total Protein: 7.8 g/dL (ref 6.0–8.5)

## 2019-07-19 LAB — LIPID PANEL
Chol/HDL Ratio: 4.4 ratio (ref 0.0–4.4)
Cholesterol, Total: 184 mg/dL (ref 100–199)
HDL: 42 mg/dL (ref >39)
LDL Chol Calc (NIH): 120 mg/dL — ABNORMAL HIGH (ref 0–99)
Triglycerides: 120 mg/dL (ref 0–149)
VLDL Cholesterol Cal: 22 mg/dL (ref 5–40)

## 2019-07-19 LAB — CBC WITH DIFFERENTIAL/PLATELET
Basophils Absolute: 0 10*3/uL (ref 0.0–0.2)
Basos: 1 %
EOS (ABSOLUTE): 0.4 10*3/uL (ref 0.0–0.4)
Eos: 5 %
Hematocrit: 34.8 % (ref 34.0–46.6)
Hemoglobin: 12.2 g/dL (ref 11.1–15.9)
Immature Grans (Abs): 0 10*3/uL (ref 0.0–0.1)
Immature Granulocytes: 0 %
Lymphocytes Absolute: 2.5 10*3/uL (ref 0.7–3.1)
Lymphs: 34 %
MCH: 30.6 pg (ref 26.6–33.0)
MCHC: 35.1 g/dL (ref 31.5–35.7)
MCV: 87 fL (ref 79–97)
Monocytes Absolute: 0.4 10*3/uL (ref 0.1–0.9)
Monocytes: 6 %
Neutrophils Absolute: 4 10*3/uL (ref 1.4–7.0)
Neutrophils: 54 %
Platelets: 342 10*3/uL (ref 150–450)
RBC: 3.99 x10E6/uL (ref 3.77–5.28)
RDW: 12.1 % (ref 11.7–15.4)
WBC: 7.3 10*3/uL (ref 3.4–10.8)

## 2019-07-19 LAB — HEMOGLOBIN A1C
Est. average glucose Bld gHb Est-mCnc: 131 mg/dL
Hgb A1c MFr Bld: 6.2 % — ABNORMAL HIGH (ref 4.8–5.6)

## 2019-07-19 LAB — TSH: TSH: 3.47 u[IU]/mL (ref 0.450–4.500)

## 2019-07-19 MED ORDER — ALPRAZOLAM 0.5 MG PO TABS: ORAL_TABLET | ORAL | 5 refills | Status: DC

## 2019-07-19 MED ORDER — ESCITALOPRAM OXALATE 20 MG PO TABS: 20.0000 mg | ORAL_TABLET | Freq: Every day | ORAL | 3 refills | Status: DC

## 2019-07-19 NOTE — Telephone Encounter (Signed)
Lisa Ashley

## 2019-07-19 NOTE — Telephone Encounter (Signed)
-----   Message from [facility], [place] sent at 07/19/2019  2:00 PM EST ----- The patient forgot to ask if you would refill her Xanax and her lexapro from her visit yesterday.  ----- Message ----- From: Mar Daring, PA-C Sent: 07/19/2019  10:11 AM EST To: Marlyn Corporal Nurse  Blood count is normal. Kidney and liver function are normal. Sodium, potassium and calcium are normal. Thyroid is normal. A1c/sugar is up compared to last year. A1c is now 6.2 from 5.9. Cholesterol is normal. Vit D is normal.

## 2019-07-23 ENCOUNTER — Telehealth: Payer: Self-pay

## 2019-07-23 DIAGNOSIS — R87612 Low grade squamous intraepithelial lesion on cytologic smear of cervix (LGSIL): Secondary | ICD-10-CM

## 2019-07-23 LAB — CYTOLOGY - PAP
Adequacy: ABSENT
Comment: NEGATIVE
High risk HPV: NEGATIVE

## 2019-07-23 NOTE — Telephone Encounter (Signed)
Patient advised as below. Patient verbalizes understanding and is in agreement with treatment plan. Patient request to go back to [facility], Dr. Marcelline Mates.

## 2019-07-23 NOTE — Telephone Encounter (Signed)
-----   Message from Mar Daring, [place] sent at 07/23/2019 11:44 AM EST ----- Your pap did return with the low grade squamous cell intraepithelial lesion like previous a few years ago. It did test negative for HPV. I would normally refer back to GYN. Would you like to return to whom you had previously seen?

## 2019-08-06 ENCOUNTER — Telehealth: Payer: Self-pay | Admitting: Physician Assistant

## 2019-08-06 NOTE — Telephone Encounter (Signed)
Mammogram ordered on 07/18/2019. Left detailed message that patient needs to call [facility] [phone].

## 2019-08-06 NOTE — Telephone Encounter (Signed)
Referral Request - Has patient seen PCP for this complaint? Yes.   *If NO, is insurance requiring patient see PCP for this issue before PCP can refer them? Referral for which specialty: [facility] for a mammogram   Reason for referral: yearly mammogram.

## 2019-08-06 NOTE — Telephone Encounter (Signed)
Patient is stating she does not want referral for [place]. Se wants the referral to [facility]. Call back [phone]

## 2019-08-07 NOTE — Telephone Encounter (Signed)
Faxed order to [facility]. Patient advised.

## 2019-08-14 DIAGNOSIS — Z1231 Encounter for screening mammogram for malignant neoplasm of breast: Secondary | ICD-10-CM | POA: Diagnosis not present

## 2019-08-14 LAB — HM MAMMOGRAPHY

## 2019-08-19 ENCOUNTER — Encounter: Payer: Self-pay | Admitting: Physician Assistant

## 2019-10-02 ENCOUNTER — Telehealth: Payer: Self-pay | Admitting: Obstetrics and Gynecology

## 2019-10-02 NOTE — Telephone Encounter (Signed)
Pt called in and stated that she was referred to [place] from her PCP. She stated she never was called I advised her we called 3 or 4 times. The pt still wants to appt. I went to CM to make sure I was scheduling her for the right visit type. I called the pt back and left a voice mail with the time and date of 11/06/2019 at 10am with [place].

## 2019-11-06 ENCOUNTER — Ambulatory Visit (INDEPENDENT_AMBULATORY_CARE_PROVIDER_SITE_OTHER): Payer: BC Managed Care – PPO | Admitting: Obstetrics and Gynecology

## 2019-11-06 ENCOUNTER — Other Ambulatory Visit (HOSPITAL_COMMUNITY)
Admission: RE | Admit: 2019-11-06 | Discharge: 2019-11-06 | Disposition: A | Discharge status: Home / Self Care | Payer: BC Managed Care – PPO | Source: Ambulatory Visit | Attending: Obstetrics and Gynecology | Admitting: Obstetrics and Gynecology

## 2019-11-06 ENCOUNTER — Encounter: Payer: Self-pay | Admitting: Obstetrics and Gynecology

## 2019-11-06 ENCOUNTER — Other Ambulatory Visit: Payer: Self-pay

## 2019-11-06 VITALS — BP 139/74 | HR 67 | Ht 71.0 in | Wt 235.8 lb

## 2019-11-06 DIAGNOSIS — N870 Mild cervical dysplasia: Secondary | ICD-10-CM | POA: Diagnosis not present

## 2019-11-06 DIAGNOSIS — R87612 Low grade squamous intraepithelial lesion on cytologic smear of cervix (LGSIL): Secondary | ICD-10-CM | POA: Insufficient documentation

## 2019-11-06 NOTE — Progress Notes (Signed)
   [facility] [address] [place], [place]  [address] Phone:  [phone]   Fax:  [phone]   GYNECOLOGY CLINIC COLPOSCOPY PROCEDURE NOTE  54 y.o. G1P1001 here for colposcopy for low-grade squamous intraepithelial neoplasia (LGSIL - encompassing HPV,mild dysplasia,CIN I) pap smear on 07/08/2019 (also with same cytology noted on pap smear in 2019). Discussed role for HPV in cervical dysplasia, need for surveillance.  Patient given informed consent, signed copy in the chart, time out was performed.  Placed in lithotomy position. Cervix viewed with speculum and colposcope after application of acetic acid.   Colposcopy adequate? No (inadequate visualization of cervical os due to cervical stenosis)  acetowhite lesion(s) noted at 12-2 o'clock; biopsy obtained at 1:00. ECC specimen obtained. All specimens were labeled and sent to pathology.  Patient was given post procedure instructions.  Will follow up pathology and manage accordingly; patient will be contacted with results and recommendations.  Routine preventative health maintenance measures emphasized.   Rubie Maid, MD [facility]

## 2019-11-06 NOTE — Patient Instructions (Signed)

## 2019-11-06 NOTE — Addendum Note (Signed)
Addended by: Edwyna Shell on: 11/06/2019 11:48 AM   Modules accepted: Orders

## 2019-11-06 NOTE — Progress Notes (Signed)
Pt present for colpo. Pt stated that she was doing well. No problems.

## 2019-11-07 LAB — SURGICAL PATHOLOGY

## 2020-05-07 ENCOUNTER — Other Ambulatory Visit: Payer: Self-pay | Admitting: Physician Assistant

## 2020-05-07 DIAGNOSIS — F419 Anxiety disorder, unspecified: Secondary | ICD-10-CM

## 2020-05-07 NOTE — Telephone Encounter (Signed)
Requested medication (s) are due for refill today- yes  Requested medication (s) are on the active medication list -yes  Future visit scheduled -yes  Last refill: 01/08/20  Notes to clinic: Request RF for non delegated Rx  Requested Prescriptions  Pending Prescriptions Disp Refills   ALPRAZolam (XANAX) 0.5 MG tablet [Pharmacy Med Name: ALPRAZolam 0.5 MG Oral Tablet] 90 tablet 0    Sig: TAKE 1 TABLET BY MOUTH DURING THE DAY AS NEEDED FOR ANXIETY AND 2 TABLETS BY MOUTH AT BEDTIME AS NEEDED FOR SLEEP      Not Delegated - Psychiatry:  Anxiolytics/Hypnotics Failed - 05/07/2020  3:00 PM      Failed - This refill cannot be delegated      Failed - Urine Drug Screen completed in last 360 days      Failed - Valid encounter within last 6 months    Recent Outpatient Visits           9 months ago Annual physical exam   [facility] [place], Clearnce Sorrel, [place]   2 years ago Annual physical exam   [facility] [place], Clearnce Sorrel, [place]   3 years ago Grief reaction   [place], Clearnce Sorrel, [place]   3 years ago [place], Clearnce Sorrel, [place]   3 years ago Annual physical exam   [facility] [place], Clearnce Sorrel, [place]       Future Appointments             In 2 months [place], Clearnce Sorrel, PA-C [employer], [place]                Requested Prescriptions  Pending Prescriptions Disp Refills   ALPRAZolam (XANAX) 0.5 MG tablet [Pharmacy Med Name: ALPRAZolam 0.5 MG Oral Tablet] 90 tablet 0    Sig: TAKE 1 TABLET BY MOUTH DURING THE DAY AS NEEDED FOR ANXIETY AND 2 TABLETS BY MOUTH AT BEDTIME AS NEEDED FOR SLEEP      Not Delegated - Psychiatry:  Anxiolytics/Hypnotics Failed - 05/07/2020  3:00 PM      Failed - This refill cannot be delegated      Failed - Urine Drug Screen completed in last 360 days      Failed - Valid encounter within last 6 months    Recent Outpatient Visits            9 months ago Annual physical exam   [facility] [place], Clearnce Sorrel, [place]   2 years ago Annual physical exam   [facility] [place], Clearnce Sorrel, [place]   3 years ago Grief reaction   [place], Clearnce Sorrel, [place]   3 years ago [place], Clearnce Sorrel, [place]   3 years ago Annual physical exam   [facility] [place], Clearnce Sorrel, [place]       Future Appointments             In 2 months [place], Clearnce Sorrel, PA-C [employer], [place]

## 2020-07-19 ENCOUNTER — Other Ambulatory Visit: Payer: Self-pay | Admitting: Physician Assistant

## 2020-07-19 DIAGNOSIS — F4321 Adjustment disorder with depressed mood: Secondary | ICD-10-CM

## 2020-07-19 NOTE — Telephone Encounter (Signed)
   Notes to clinic:  script has expired Review for refill   Requested Prescriptions  Pending Prescriptions Disp Refills   escitalopram (LEXAPRO) 20 MG tablet [Pharmacy Med Name: Escitalopram Oxalate 20 MG Oral Tablet] 90 tablet 0    Sig: Take 1 tablet by mouth once daily      Psychiatry:  Antidepressants - SSRI Failed - 07/19/2020  6:31 AM      Failed - Completed PHQ-2 or PHQ-9 in the last 360 days      Failed - Valid encounter within last 6 months    Recent Outpatient Visits           1 year ago Annual physical exam   [facility] [place], Clearnce Sorrel, [place]   2 years ago Annual physical exam   [facility] [place], Clearnce Sorrel, [place]   3 years ago Grief reaction   [place], Clearnce Sorrel, [place]   3 years ago [place], Clearnce Sorrel, [place]   4 years ago Annual physical exam   [facility] [place], [place], [place]

## 2020-07-23 ENCOUNTER — Encounter: Payer: BC Managed Care – PPO | Admitting: Physician Assistant

## 2020-09-09 ENCOUNTER — Encounter: Payer: Self-pay | Admitting: Physician Assistant
